# Patient Record
Sex: Male | Born: 1975 | Race: White | Hispanic: No | Marital: Single | Smoking: Current every day smoker
Health system: Southern US, Community
[De-identification: ages and names within clinical notes are randomized; demographics above are authoritative.]

## PROBLEM LIST (undated history)

## (undated) DIAGNOSIS — F32A Depression, unspecified: Secondary | ICD-10-CM

## (undated) DIAGNOSIS — R059 Cough, unspecified: Secondary | ICD-10-CM

## (undated) DIAGNOSIS — E8809 Other disorders of plasma-protein metabolism, not elsewhere classified: Secondary | ICD-10-CM

## (undated) DIAGNOSIS — N200 Calculus of kidney: Secondary | ICD-10-CM

## (undated) DIAGNOSIS — T8859XA Other complications of anesthesia, initial encounter: Secondary | ICD-10-CM

## (undated) DIAGNOSIS — F329 Major depressive disorder, single episode, unspecified: Secondary | ICD-10-CM

## (undated) DIAGNOSIS — S43439A Superior glenoid labrum lesion of unspecified shoulder, initial encounter: Secondary | ICD-10-CM

## (undated) DIAGNOSIS — T4145XA Adverse effect of unspecified anesthetic, initial encounter: Secondary | ICD-10-CM

## (undated) DIAGNOSIS — R202 Paresthesia of skin: Secondary | ICD-10-CM

## (undated) DIAGNOSIS — R05 Cough: Secondary | ICD-10-CM

## (undated) HISTORY — PX: CLEFT LIP REPAIR: SUR1164

## (undated) HISTORY — PX: KNEE ARTHROSCOPY: SUR90

## (undated) HISTORY — PX: JOINT REPLACEMENT: SHX530

## (undated) HISTORY — PX: SHOULDER ARTHROSCOPY: SHX128

---
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
## 1997-12-07 ENCOUNTER — Emergency Department (HOSPITAL_COMMUNITY): Admission: EM | Admit: 1997-12-07 | Discharge: 1997-12-07 | Payer: Self-pay | Admitting: Emergency Medicine

## 1999-07-12 ENCOUNTER — Emergency Department (HOSPITAL_COMMUNITY): Admission: EM | Admit: 1999-07-12 | Discharge: 1999-07-12 | Payer: Self-pay

## 2001-03-27 ENCOUNTER — Emergency Department (HOSPITAL_COMMUNITY): Admission: EM | Admit: 2001-03-27 | Discharge: 2001-03-27 | Payer: Self-pay | Admitting: Emergency Medicine

## 2001-04-27 ENCOUNTER — Emergency Department (HOSPITAL_COMMUNITY): Admission: EM | Admit: 2001-04-27 | Discharge: 2001-04-27 | Payer: Self-pay

## 2002-01-11 ENCOUNTER — Emergency Department (HOSPITAL_COMMUNITY): Admission: EM | Admit: 2002-01-11 | Discharge: 2002-01-11 | Payer: Self-pay

## 2002-01-15 ENCOUNTER — Ambulatory Visit (HOSPITAL_COMMUNITY): Admission: RE | Admit: 2002-01-15 | Discharge: 2002-01-15 | Payer: Self-pay

## 2002-01-29 ENCOUNTER — Ambulatory Visit (HOSPITAL_COMMUNITY): Admission: RE | Admit: 2002-01-29 | Discharge: 2002-01-29 | Payer: Self-pay | Admitting: General Surgery

## 2002-01-29 ENCOUNTER — Encounter (INDEPENDENT_AMBULATORY_CARE_PROVIDER_SITE_OTHER): Payer: Self-pay | Admitting: Specialist

## 2002-01-29 HISTORY — PX: LAPAROSCOPIC CHOLECYSTECTOMY: SUR755

## 2004-06-24 ENCOUNTER — Emergency Department (HOSPITAL_COMMUNITY): Admission: EM | Admit: 2004-06-24 | Discharge: 2004-06-25 | Payer: Self-pay | Admitting: Emergency Medicine

## 2006-09-04 ENCOUNTER — Encounter: Admission: RE | Admit: 2006-09-04 | Discharge: 2006-09-04 | Payer: Self-pay | Admitting: Family Medicine

## 2009-10-17 ENCOUNTER — Ambulatory Visit (HOSPITAL_COMMUNITY): Admission: RE | Admit: 2009-10-17 | Discharge: 2009-10-17 | Payer: Self-pay | Admitting: Orthopedic Surgery

## 2009-11-13 ENCOUNTER — Ambulatory Visit: Payer: Self-pay | Admitting: Diagnostic Radiology

## 2009-11-13 ENCOUNTER — Emergency Department (HOSPITAL_BASED_OUTPATIENT_CLINIC_OR_DEPARTMENT_OTHER): Admission: EM | Admit: 2009-11-13 | Discharge: 2009-11-13 | Payer: Self-pay | Admitting: Emergency Medicine

## 2010-08-24 LAB — URINALYSIS, ROUTINE W REFLEX MICROSCOPIC
Bilirubin Urine: NEGATIVE
Glucose, UA: NEGATIVE mg/dL
Ketones, ur: NEGATIVE mg/dL
Nitrite: NEGATIVE
Protein, ur: 30 mg/dL — AB
Specific Gravity, Urine: 1.024 (ref 1.005–1.030)
Urobilinogen, UA: 1 mg/dL (ref 0.0–1.0)
pH: 6 (ref 5.0–8.0)

## 2010-08-24 LAB — URINE MICROSCOPIC-ADD ON

## 2010-10-23 NOTE — Op Note (Signed)
NAME:  Tyler Guerrero, Tyler Guerrero                        ACCOUNT NO.:  [id number]   MEDICAL RECORD NO.:  [id number]                   PATIENT TYPE:  OIB   LOCATION:  6733                                 FACILITY:  [facility]   PHYSICIAN:  Marta Lamas. Gae Bon, M.D.            DATE OF BIRTH:  02-22-1976   DATE OF PROCEDURE:  DATE OF DISCHARGE:  01/29/2002                                 OPERATIVE REPORT   PREOPERATIVE DIAGNOSES:  Symptomatic cholelithiasis.   POSTOPERATIVE DIAGNOSES:  Symptomatic cholelithiasis with subacute  cholecystitis.   OPERATION PERFORMED:  Laparoscopic cholecystectomy.   SURGEON:  Marta Lamas. Lindie Spruce, M.D.   ASSISTANT:  Gabrielle Dare. Janee Morn, M.D.   ANESTHESIA:  General endotracheal.   ESTIMATED BLOOD LOSS:  Less than 20 cc.   COMPLICATIONS:  None.   CONDITION:  Stable.   INDICATIONS FOR PROCEDURE:  The patient is a 35 year old male with acute  gallbladder disease diagnosed by ultrasound recently in the emergency  department and comes in now for  laparoscopic cholecystectomy.   OPERATIVE FINDINGS:  The patient had some omental adhesions to the  gallbladder which was thickened and mildly acutely inflamed.  The anatomy  was otherwise normal.   DESCRIPTION OF PROCEDURE:  The patient was taken to the operating room and  placed on the table in the supine position.  After an adequate endotracheal  anesthetic was administered, the patient was prepped and draped in the usual  sterile manner exposing the midline in the right upper quadrant.   A superumbilical curvilinear incision was made using a #11 blade and taken  down to the midline fascia.  It was through this fascia that a Veress needle  was passed into the peritoneal cavity and confirmed to be in adequate  position using the saline test.  Once this was done, carbon dioxide  infiltration up to a maximal intra-abdominal pressure of 15 mmHg was  instilled into the peritoneal cavity.  Subsequently a 10-11 mm cannula and  trocar was passed through the superumbilical fascia into the peritoneal  cavity and confirmed to be in position with the laparoscope and attached  camera and light source.  Once this was done, two right costal margin 5 mm  cannulas were used in the subxiphoid.  11-12 mm cannula was passed under  direct vision into the peritoneal cavity.  Once all cannulas were on place,  we put on the appropriate adaptors.  The patient was placed in steeper  reversed Trendelenburg position and the left side was tilted down.   Using a ratcheted grasper the dome of the gallbladder was retracted toward  the anterior abdominal wall and towards the right upper quadrant.  A second  grasper was passed down to the infundibulum of the gallbladder.  We used a  dissector to dissect out the peritoneum over the triangle of Calot and  hepatoduodenal triangle.  An adequate window was made behind both the cystic  duct  and the cystic artery both of which were endoclipped proximally and  distally.  Both were transected and then subsequently the gallbladder  dissected out of its bed with minimal difficulty, no cholangiogram was done.   The anatomy was straight forward with the cystic artery coursing through the  triangle of Calot.  Once we had the gallbladder dissected out of its bed, it  was brought out through the superumbilical fascia using large grasper.  The  large stone that was contained in the gallbladder had to be crushed in the  open gallbladder at the level of the superumbilical skin.  Once we had the  gallbladder removed, we irrigated with approximately 1.2L of saline.  There  was minimal bleeding.  We allowed all cannulas to be removed and gas escaped  through the cannula sites.   The superumbilical fascia was reapproximated using a figure-of-eight stitch  of 0 Vicryl passed on the UR6 needle.  We then closed the skin using running  subcuticular suture of 4-0 Vicryl.  17 cc of Marcaine was used at all sites   and then sterile dressings were applied.                                                 Marta Lamas. Gae Bon, M.D.    JOW/MEDQ  D:  01/29/2002  T:  01/31/2002  Job:  [id number]

## 2010-10-23 NOTE — H&P (Signed)
   NAME:  Tyler Guerrero, Tyler Guerrero                        ACCOUNT NO.:  [id number]   MEDICAL RECORD NO.:  [id number]                   PATIENT TYPE:  OIB   LOCATION:  6733                                 FACILITY:  [facility]   PHYSICIAN:  Marta Lamas. Gae Bon, M.D.            DATE OF BIRTH:  1976/04/13   DATE OF ADMISSION:  01/29/2002  DATE OF DISCHARGE:  01/29/2002                                HISTORY & PHYSICAL   CHIEF COMPLAINT:  The patient is an 35 year old male seen in [facility] emergency room recently with symptomatic cholelithiasis.   HISTORY OF PRESENT ILLNESS:  The patient was seen in my office on January 16, 2002, after being seen about a week prior to that in the [facility]  emergency room with abdominal pain in a band extending across the  epigastrium and right upper quadrant.  He had no nausea, vomiting, or  fevers, but possibly some chills.  He had no jaundice.  This was the first  episode of this type and he cannot exactly pinpoint it to a certain type of  food, however, it has improved with avoidance of greasy, fried, and spicy  foods.   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   FAMILY HISTORY:  Unremarkable.   SOCIAL HISTORY:  He is a one-pack-per-day smoker and has been for 15 years.   REVIEW OF SYSTEMS:  Unremarkable.   PHYSICAL EXAMINATION:  VITAL SIGNS:  Blood pressure 140/98.  HEENT:  He is normocephalic and atraumatic and anicteric.  NECK:  Supple with no bruits.  CHEST:  Clear to auscultation.  HEART:  Regular rate and rhythm with no murmurs, gallops, liths, or heaves.  ABDOMEN:  Soft.  He has no tenderness in the right upper quadrant or the  epigastrium, however, he had recently took his pain medicine.  RECTAL:  Not performed.   LABORATORY DATA:  All pending.   IMPRESSION:  Symptomatic cholelithiasis in an otherwise healthy, 35 year old  male, who is a smoker.   PLAN:   Preoperative antibiotics and laparoscopic cholecystectomy and  possible cholangiogram depending upon of dictations of liver function tests.  However, this will not be done routinely as anatomy is straightforward.  PAS  hose in the OR will be placed.                                               Marta Lamas. Gae Bon, M.D.    JOW/MEDQ  D:  01/29/2002  T:  01/30/2002  Job:  [id number]

## 2011-07-30 ENCOUNTER — Encounter (HOSPITAL_BASED_OUTPATIENT_CLINIC_OR_DEPARTMENT_OTHER): Payer: Self-pay | Admitting: Emergency Medicine

## 2011-07-30 ENCOUNTER — Emergency Department (INDEPENDENT_AMBULATORY_CARE_PROVIDER_SITE_OTHER): Payer: 59

## 2011-07-30 ENCOUNTER — Emergency Department (HOSPITAL_BASED_OUTPATIENT_CLINIC_OR_DEPARTMENT_OTHER)
Admission: EM | Admit: 2011-07-30 | Discharge: 2011-07-30 | Disposition: A | Discharge status: Home Health | Payer: 59 | Attending: Emergency Medicine | Admitting: Emergency Medicine

## 2011-07-30 DIAGNOSIS — F17200 Nicotine dependence, unspecified, uncomplicated: Secondary | ICD-10-CM | POA: Insufficient documentation

## 2011-07-30 DIAGNOSIS — M25519 Pain in unspecified shoulder: Secondary | ICD-10-CM | POA: Insufficient documentation

## 2011-07-30 MED ORDER — OXYCODONE-ACETAMINOPHEN 5-325 MG PO TABS: 2.0000 | ORAL_TABLET | ORAL | Status: AC | PRN

## 2011-07-30 NOTE — ED Provider Notes (Signed)
History     CSN: [id number]  Arrival date & time 07/30/11  0108   First MD Initiated Contact with Patient 07/30/11 0308      Chief Complaint  Patient presents with  . Shoulder Pain    (Consider location/radiation/quality/duration/timing/severity/associated sxs/prior treatment) HPI  36yoM previously heatlhy pw Rt shoulder pain x 2 weeks. Describes as sharp, worse with movement. His pain as a 4/5 at this time. No h/o similar. Denies radiation down his arm or from his neck. He denies numbness, tingling, weakness of his arm. There is no history of trauma. Denies other complaints at this time including no chest pain, shortness of breath.   ED Notes, ED Provider Notes from 07/30/11 0000 to 07/30/11 01:19:18       Bufford Buttner, RN 07/30/2011 01:17      Pt c/o right shoulder pain x 2 weeks. No known injury     History reviewed. No pertinent past medical history.  Past Surgical History  Procedure Date  . Knee arthroscopy   . Shoulder surgery     No family history on file.  History  Substance Use Topics  . Smoking status: Current Everyday Smoker  . Smokeless tobacco: Not on file  . Alcohol Use: Yes      Review of Systems  All other systems reviewed and are negative.   except as noted HPI  Allergies  Review of patient's allergies indicates no known allergies.  Home Medications   Current Outpatient Rx  Name Route Sig Dispense Refill  . IBUPROFEN 400 MG PO TABS Oral Take by mouth as needed.    Marland Kitchen NAPROXEN 375 MG PO TABS Oral Take by mouth as needed.    . OXYCODONE-ACETAMINOPHEN 5-325 MG PO TABS Oral Take 2 tablets by mouth every 4 (four) hours as needed for pain. 15 tablet 0    BP 151/92  Pulse 79  Temp(Src) 98.1 F (36.7 C) (Oral)  Resp 18  SpO2 99%  Physical Exam  Nursing note and vitals reviewed. Constitutional: He is oriented to person, place, and time. He appears well-developed and well-nourished. No distress.  HENT:  Head: Atraumatic.    Mouth/Throat: Oropharynx is clear and moist.  Eyes: Conjunctivae are normal. Pupils are equal, round, and reactive to light.  Neck: Neck supple.  Cardiovascular: Normal rate, regular rhythm, normal heart sounds and intact distal pulses.  Exam reveals no gallop and no friction rub.   No murmur heard. Pulmonary/Chest: Effort normal. No respiratory distress. He has no wheezes. He has no rales.  Abdominal: Soft. Bowel sounds are normal. There is no tenderness. There is no rebound and no guarding.  Musculoskeletal: Normal range of motion. He exhibits no edema and no tenderness.       Shoulder without ecchymosis, deformity. There is minimal tenderness to palpation at the a.c. joint. Strength 5/ 5 at the shoulder, elbow, grip. Gross sensation intact. Distal pulses intact. Capillary refill < 3 sec  Neurological: He is alert and oriented to person, place, and time.  Skin: Skin is warm and dry.  Psychiatric: He has a normal mood and affect.    ED Course  Procedures (including critical care time)  Labs Reviewed - No data to display Dg Shoulder Right  07/30/2011  *RADIOLOGY REPORT*  Clinical Data: Right shoulder pain.  No known injury.  RIGHT SHOULDER - 2+ VIEW  Comparison: 07/19/2011.  Findings: Mild to moderate inferior glenohumeral spur formation is currently visible.  Minimal inferior acromioclavicular spur formation.  IMPRESSION: Degenerative changes, as  described above.  Original Report Authenticated By: Darrol Angel, M.D.    1. Shoulder pain     MDM  Shoulder pain unk cause. Degenerative joint disease as above. Neurovasc intact. No trauma or fracture. Full ROM. Will have him f/u with his orthopedic surgeon or sports medicine as needed if symptoms persist. Comfortable with plans for discharge home.        Forbes Cellar, MD 07/30/11 [phone]

## 2011-07-30 NOTE — ED Notes (Signed)
Pt c/o right shoulder pain x 2 weeks. No known injury

## 2011-10-08 ENCOUNTER — Other Ambulatory Visit: Payer: Self-pay | Admitting: Otolaryngology

## 2011-10-26 ENCOUNTER — Encounter (HOSPITAL_BASED_OUTPATIENT_CLINIC_OR_DEPARTMENT_OTHER): Payer: Self-pay | Admitting: *Deleted

## 2011-10-26 NOTE — Progress Notes (Signed)
NPO AFTER MN. ARRIVES AT 0700. NEEDS HG AND EKG. PT STATES WT. 340LB, LAST WEIGHED IN FEB 2013. PT ENCOURAGED TO COME BEFORE DOS TO GET WEIGHED. PT CAN COME ON Thursday 10-28-2011 AFTERNOON. ALSO, WILL REVIEW CHART W/ MDA.

## 2011-10-29 NOTE — Progress Notes (Signed)
PT CAME TODAY TO BE WEIGHED.  WT. 334LB.

## 2011-11-02 NOTE — Anesthesia Preprocedure Evaluation (Addendum)
Anesthesia Evaluation  Patient identified by MRN, date of birth, ID band Patient awake  General Assessment Comment:Prior cleft lip repair  Reviewed: Allergy & Precautions, H&P , NPO status , Patient's Chart, lab work & pertinent test results  Airway Mallampati: III TM Distance: >3 FB Neck ROM: Full    Dental  (+) Teeth Intact, Caps and Dental Advisory Given,    Pulmonary Current Smoker (22 vpack years),    + wheezing      Cardiovascular negative cardio ROS  Rhythm:Regular Rate:Normal     Neuro/Psych negative neurological ROS  negative psych ROS   GI/Hepatic negative GI ROS, Neg liver ROS,   Endo/Other  Morbid obesity  Renal/GU negative Renal ROS  negative genitourinary   Musculoskeletal negative musculoskeletal ROS (+)   Abdominal (+) + obese,   Peds  Hematology negative hematology ROS (+)   Anesthesia Other Findings Permanent bridge upper incisors  Reproductive/Obstetrics negative OB ROS                         Anesthesia Physical Anesthesia Plan  ASA: III  Anesthesia Plan: General   Post-op Pain Management:    Induction: Intravenous  Airway Management Planned: Oral ETT  Additional Equipment:   Intra-op Plan:   Post-operative Plan: Extubation in OR  Informed Consent: I have reviewed the patients History and Physical, chart, labs and discussed the procedure including the risks, benefits and alternatives for the proposed anesthesia with the patient or authorized representative who has indicated his/her understanding and acceptance.   Dental advisory given  Plan Discussed with: CRNA  Anesthesia Plan Comments:         Anesthesia Quick Evaluation

## 2011-11-03 ENCOUNTER — Encounter (HOSPITAL_BASED_OUTPATIENT_CLINIC_OR_DEPARTMENT_OTHER): Payer: Self-pay | Admitting: *Deleted

## 2011-11-03 ENCOUNTER — Encounter (HOSPITAL_BASED_OUTPATIENT_CLINIC_OR_DEPARTMENT_OTHER): Payer: Self-pay | Admitting: Anesthesiology

## 2011-11-03 ENCOUNTER — Encounter (HOSPITAL_COMMUNITY)
Admission: RE | Disposition: A | Discharge status: Home / Self Care | Payer: Self-pay | Source: Ambulatory Visit | Attending: Specialist

## 2011-11-03 ENCOUNTER — Ambulatory Visit (HOSPITAL_BASED_OUTPATIENT_CLINIC_OR_DEPARTMENT_OTHER)
Admission: RE | Admit: 2011-11-03 | Discharge: 2011-11-03 | Disposition: A | Discharge status: Home / Self Care | Payer: 59 | Source: Ambulatory Visit | Attending: Specialist | Admitting: Specialist

## 2011-11-03 ENCOUNTER — Ambulatory Visit (HOSPITAL_BASED_OUTPATIENT_CLINIC_OR_DEPARTMENT_OTHER): Payer: 59 | Admitting: Anesthesiology

## 2011-11-03 DIAGNOSIS — M25819 Other specified joint disorders, unspecified shoulder: Secondary | ICD-10-CM | POA: Insufficient documentation

## 2011-11-03 DIAGNOSIS — M19019 Primary osteoarthritis, unspecified shoulder: Secondary | ICD-10-CM | POA: Insufficient documentation

## 2011-11-03 DIAGNOSIS — M7540 Impingement syndrome of unspecified shoulder: Secondary | ICD-10-CM

## 2011-11-03 DIAGNOSIS — M24119 Other articular cartilage disorders, unspecified shoulder: Secondary | ICD-10-CM | POA: Insufficient documentation

## 2011-11-03 DIAGNOSIS — Z79899 Other long term (current) drug therapy: Secondary | ICD-10-CM | POA: Insufficient documentation

## 2011-11-03 HISTORY — DX: Paresthesia of skin: R20.2

## 2011-11-03 HISTORY — DX: Superior glenoid labrum lesion of unspecified shoulder, initial encounter: S43.439A

## 2011-11-03 HISTORY — PX: SHOULDER ARTHROSCOPY: SHX128

## 2011-11-03 LAB — POCT HEMOGLOBIN-HEMACUE: Hemoglobin: 15.8 g/dL (ref 13.0–17.0)

## 2011-11-03 LAB — POTASSIUM: Potassium: 4.3 mEq/L (ref 3.5–5.1)

## 2011-11-03 SURGERY — ARTHROSCOPY, SHOULDER
Anesthesia: General | Site: Shoulder | Laterality: Right | Wound class: Clean

## 2011-11-03 MED ORDER — HYDROMORPHONE HCL PF 1 MG/ML IJ SOLN
INTRAMUSCULAR | Status: AC
  Administered 2011-11-03: 0.5 mg via INTRAVENOUS
  Administered 2011-11-03: 0.5 mg
  Filled 2011-11-03: qty 1

## 2011-11-03 MED ORDER — ONDANSETRON HCL 4 MG/2ML IJ SOLN
INTRAMUSCULAR | Status: DC | PRN
  Administered 2011-11-03: 4 mg via INTRAVENOUS

## 2011-11-03 MED ORDER — LACTATED RINGERS IV SOLN
INTRAVENOUS | Status: DC
Start: 2011-11-03 — End: 2011-11-03

## 2011-11-03 MED ORDER — PROPOFOL 10 MG/ML IV EMUL
INTRAVENOUS | Status: DC | PRN
  Administered 2011-11-03 (×2): 25 mg via INTRAVENOUS
  Administered 2011-11-03: 300 mg via INTRAVENOUS

## 2011-11-03 MED ORDER — LACTATED RINGERS IV SOLN: INTRAVENOUS | Status: DC

## 2011-11-03 MED ORDER — DEXAMETHASONE SODIUM PHOSPHATE 4 MG/ML IJ SOLN
INTRAMUSCULAR | Status: DC | PRN
  Administered 2011-11-03: 10 mg via INTRAVENOUS

## 2011-11-03 MED ORDER — OXYCODONE-ACETAMINOPHEN 5-325 MG PO TABS: 1.0000 | ORAL_TABLET | ORAL | Status: AC | PRN

## 2011-11-03 MED ORDER — KETOROLAC TROMETHAMINE 10 MG PO TABS: 10.0000 mg | ORAL_TABLET | Freq: Four times a day (QID) | ORAL | Status: AC | PRN

## 2011-11-03 MED ORDER — OXYCODONE-ACETAMINOPHEN 5-325 MG PO TABS
ORAL_TABLET | ORAL | Status: AC
  Filled 2011-11-03: qty 1

## 2011-11-03 MED ORDER — HYDROMORPHONE HCL PF 1 MG/ML IJ SOLN: 0.5000 mg | INTRAMUSCULAR | Status: DC | PRN

## 2011-11-03 MED ORDER — CEFAZOLIN SODIUM-DEXTROSE 2-3 GM-% IV SOLR
2.0000 g | Freq: Four times a day (QID) | INTRAVENOUS | Status: DC
  Filled 2011-11-03 (×3): qty 50

## 2011-11-03 MED ORDER — LACTATED RINGERS IV SOLN
INTRAVENOUS | Status: DC
  Administered 2011-11-03 (×2): via INTRAVENOUS

## 2011-11-03 MED ORDER — MIDAZOLAM HCL 5 MG/ML IJ SOLN
INTRAMUSCULAR | Status: AC
  Administered 2011-11-03: 1 mg via INTRAVENOUS
  Filled 2011-11-03: qty 1

## 2011-11-03 MED ORDER — MIDAZOLAM HCL 5 MG/5ML IJ SOLN
INTRAMUSCULAR | Status: DC | PRN
  Administered 2011-11-03: 2 mg via INTRAVENOUS

## 2011-11-03 MED ORDER — FENTANYL CITRATE 0.05 MG/ML IJ SOLN: 25.0000 ug | INTRAMUSCULAR | Status: DC | PRN

## 2011-11-03 MED ORDER — SODIUM CHLORIDE 0.9 % IR SOLN
Status: DC | PRN
  Administered 2011-11-03: 09:00:00

## 2011-11-03 MED ORDER — FENTANYL CITRATE 0.05 MG/ML IJ SOLN
INTRAMUSCULAR | Status: DC | PRN
  Administered 2011-11-03: 50 ug via INTRAVENOUS
  Administered 2011-11-03: 100 ug via INTRAVENOUS
  Administered 2011-11-03: 50 ug via INTRAVENOUS

## 2011-11-03 MED ORDER — LIDOCAINE HCL (CARDIAC) 20 MG/ML IV SOLN
INTRAVENOUS | Status: DC | PRN
  Administered 2011-11-03: 80 mg via INTRAVENOUS

## 2011-11-03 MED ORDER — CHLORHEXIDINE GLUCONATE 4 % EX LIQD: 60.0000 mL | Freq: Once | CUTANEOUS | Status: DC

## 2011-11-03 MED ORDER — PROMETHAZINE HCL 25 MG/ML IJ SOLN: 6.2500 mg | INTRAMUSCULAR | Status: DC | PRN

## 2011-11-03 MED ORDER — CEFAZOLIN SODIUM-DEXTROSE 2-3 GM-% IV SOLR
2.0000 g | INTRAVENOUS | Status: AC
  Administered 2011-11-03: 2 g via INTRAVENOUS

## 2011-11-03 MED ORDER — SODIUM CHLORIDE 0.9 % IV SOLN
INTRAVENOUS | Status: DC
  Administered 2011-11-03: 12:00:00 via INTRAVENOUS

## 2011-11-03 MED ORDER — KETOROLAC TROMETHAMINE 10 MG PO TABS
10.0000 mg | ORAL_TABLET | Freq: Once | ORAL | Status: AC
  Administered 2011-11-03: 10 mg via ORAL
  Filled 2011-11-03: qty 1

## 2011-11-03 MED ORDER — ALBUTEROL SULFATE (5 MG/ML) 0.5% IN NEBU
2.5000 mg | INHALATION_SOLUTION | Freq: Once | RESPIRATORY_TRACT | Status: AC
  Administered 2011-11-03: 2.5 mg via RESPIRATORY_TRACT

## 2011-11-03 MED ORDER — BUPIVACAINE-EPINEPHRINE 0.25% -1:200000 IJ SOLN
INTRAMUSCULAR | Status: DC | PRN
  Administered 2011-11-03: 2 mL

## 2011-11-03 MED ORDER — MIDAZOLAM HCL 2 MG/2ML IJ SOLN
0.5000 mg | INTRAMUSCULAR | Status: DC | PRN
  Administered 2011-11-03 (×2): 1 mg via INTRAVENOUS

## 2011-11-03 MED ORDER — OXYCODONE-ACETAMINOPHEN 5-325 MG PO TABS
1.0000 | ORAL_TABLET | ORAL | Status: DC | PRN
  Administered 2011-11-03: 1 via ORAL

## 2011-11-03 MED ORDER — SUCCINYLCHOLINE CHLORIDE 20 MG/ML IJ SOLN
INTRAMUSCULAR | Status: DC | PRN
  Administered 2011-11-03: 160 mg via INTRAVENOUS

## 2011-11-03 SURGICAL SUPPLY — 76 items
APL SKNCLS STERI-STRIP NONHPOA (GAUZE/BANDAGES/DRESSINGS)
BENZOIN TINCTURE PRP APPL 2/3 (GAUZE/BANDAGES/DRESSINGS) IMPLANT
BLADE 4.2CUDA (BLADE) ×3 IMPLANT
BLADE CUDA SHAVER 3.5 (BLADE) ×3 IMPLANT
BLADE CUTTER GATOR 3.5 (BLADE) IMPLANT
BLADE FLAT COURSE (BLADE) IMPLANT
BLADE GREAT WHITE 4.2 (BLADE) IMPLANT
BLADE SURG 11 STRL SS (BLADE) ×3 IMPLANT
BNDG COHESIVE 4X5 TAN NS LF (GAUZE/BANDAGES/DRESSINGS) ×3 IMPLANT
BUR OVAL 4.0 (BURR) IMPLANT
BUR VERTEX HOODED 4.5 (BURR) IMPLANT
CANISTER SUCT LVC 12 LTR MEDI- (MISCELLANEOUS) ×3 IMPLANT
CANISTER SUCTION 2500CC (MISCELLANEOUS) IMPLANT
CANNULA ACUFLEX KIT 5X76 (CANNULA) ×3 IMPLANT
CANNULA SHOULDER 7CM (CANNULA) IMPLANT
CANNULA TWIST IN 8.25X7CM (CANNULA) IMPLANT
CLEANER CAUTERY TIP 5X5 PAD (MISCELLANEOUS) IMPLANT
CLOTH BEACON ORANGE TIMEOUT ST (SAFETY) ×3 IMPLANT
DRAPE LG THREE QUARTER DISP (DRAPES) IMPLANT
DRAPE STERI 35X30 U-POUCH (DRAPES) ×3 IMPLANT
DRAPE U 20/CS (DRAPES) ×6 IMPLANT
DRSG PAD ABDOMINAL 8X10 ST (GAUZE/BANDAGES/DRESSINGS) ×2 IMPLANT
DURAPREP 26ML APPLICATOR (WOUND CARE) ×3 IMPLANT
ELECT MENISCUS 165MM 90D (ELECTRODE) IMPLANT
ELECT NEEDLE TIP 2.8 STRL (NEEDLE) IMPLANT
ELECT REM PT RETURN 9FT ADLT (ELECTROSURGICAL) ×3
ELECTRODE REM PT RTRN 9FT ADLT (ELECTROSURGICAL) ×2 IMPLANT
GLOVE BIO SURGEON STRL SZ 6.5 (GLOVE) ×3 IMPLANT
GLOVE BIOGEL PI IND STRL 6.5 (GLOVE) ×2 IMPLANT
GLOVE BIOGEL PI INDICATOR 6.5 (GLOVE) ×1
GLOVE ECLIPSE 6.0 STRL STRAW (GLOVE) ×2 IMPLANT
GLOVE INDICATOR 7.0 STRL GRN (GLOVE) ×3 IMPLANT
GLOVE SURG SS PI 8.0 STRL IVOR (GLOVE) ×3 IMPLANT
GOWN STRL NON-REIN LRG LVL3 (GOWN DISPOSABLE) IMPLANT
IV NS IRRIG 3000ML ARTHROMATIC (IV SOLUTION) ×4 IMPLANT
NDL SAFETY ECLIPSE 18X1.5 (NEEDLE) ×2 IMPLANT
NDL SPNL 18GX3.5 QUINCKE PK (NEEDLE) ×1 IMPLANT
NDL SUT 6 .5 CRC .975X.05 MAYO (NEEDLE) IMPLANT
NEEDLE 1/2 CIR CATGUT .05X1.09 (NEEDLE) IMPLANT
NEEDLE FILTER BLUNT 18X 1/2SAF (NEEDLE) ×1
NEEDLE FILTER BLUNT 18X1 1/2 (NEEDLE) ×2 IMPLANT
NEEDLE HYPO 18GX1.5 SHARP (NEEDLE) ×3
NEEDLE MAYO TAPER (NEEDLE)
NEEDLE SPNL 18GX3.5 QUINCKE PK (NEEDLE) ×3 IMPLANT
NS IRRIG 500ML POUR BTL (IV SOLUTION) IMPLANT
PACK BASIN DAY SURGERY FS (CUSTOM PROCEDURE TRAY) ×3 IMPLANT
PACK SHOULDER CUSTOM OPM052 (CUSTOM PROCEDURE TRAY) ×3 IMPLANT
PAD CLEANER CAUTERY TIP 5X5 (MISCELLANEOUS)
SET ARTHROSCOPY TUBING (MISCELLANEOUS) ×3
SET ARTHROSCOPY TUBING LN (MISCELLANEOUS) ×2 IMPLANT
SLING ARM FOAM STRAP LRG (SOFTGOODS) IMPLANT
SLING ARM FOAM STRAP XLG (SOFTGOODS) ×2 IMPLANT
SLING ARM IMMOBILIZER LRG (SOFTGOODS) ×3 IMPLANT
SLING ULTRA II LARGE (SOFTGOODS) IMPLANT
SPONGE GAUZE 4X4 12PLY (GAUZE/BANDAGES/DRESSINGS) ×2 IMPLANT
STAPLER VISISTAT 35W (STAPLE) IMPLANT
STRIP CLOSURE SKIN 1/2X4 (GAUZE/BANDAGES/DRESSINGS) IMPLANT
SUT BONE WAX W31G (SUTURE) IMPLANT
SUT ETHIBOND 1 OS 2 18 CR3 (SUTURE) IMPLANT
SUT ETHIBOND 2 OS 4 DA (SUTURE) IMPLANT
SUT ETHILON 4 0 PS 2 18 (SUTURE) ×3 IMPLANT
SUT FIBERWIRE #2 38 REV NDL BL (SUTURE)
SUT MON AB 4-0 PC3 18 (SUTURE) IMPLANT
SUT PDS AB 0 CT 36 (SUTURE) IMPLANT
SUT PROLENE 3 0 PS 2 (SUTURE) IMPLANT
SUT VIC AB 1 CT1 36 (SUTURE) IMPLANT
SUT VIC AB 1-0 CT2 27 (SUTURE) IMPLANT
SUT VIC AB 2-0 CT2 27 (SUTURE) ×1 IMPLANT
SUT VICRYL 0 UR6 27IN ABS (SUTURE) ×2 IMPLANT
SUT VICRYL 0-0 OS 2 NEEDLE (SUTURE) IMPLANT
SUTURE FIBERWR#2 38 REV NDL BL (SUTURE) IMPLANT
SYRINGE 10CC LL (SYRINGE) ×3 IMPLANT
TAPE HYPAFIX 6X30 (GAUZE/BANDAGES/DRESSINGS) ×3 IMPLANT
TOWEL OR 17X24 6PK STRL BLUE (TOWEL DISPOSABLE) ×6 IMPLANT
WAND 90 DEG TURBOVAC W/CORD (SURGICAL WAND) IMPLANT
WATER STERILE IRR 500ML POUR (IV SOLUTION) ×5 IMPLANT

## 2011-11-03 NOTE — Brief Op Note (Signed)
11/03/2011  9:27 AM  PATIENT:  Tyler Guerrero  36 y.o. male  PRE-OPERATIVE DIAGNOSIS:  right shoulder labrial tear  POST-OPERATIVE DIAGNOSIS:  right shoulder labrial tear  PROCEDURE:  Procedure(s) (LRB): ARTHROSCOPY SHOULDER (Right)  SURGEON:  Surgeon(s) and Role:    * Javier Docker, MD - Primary  PHYSICIAN ASSISTANT:   ASSISTANTS: strader   ANESTHESIA:   general  EBL:     BLOOD ADMINISTERED:none  DRAINS: none   LOCAL MEDICATIONS USED:  MARCAINE     SPECIMEN:  No Specimen  DISPOSITION OF SPECIMEN:  N/A  COUNTS:  YES  TOURNIQUET:  * No tourniquets in log *  DICTATION: .Other Dictation: Dictation Number [id number]  PLAN OF CARE: Admit to inpatient   PATIENT DISPOSITION:  ICU - intubated and hemodynamically stable.   Delay start of Pharmacological VTE agent (>24hrs) due to surgical blood loss or risk of bleeding: no

## 2011-11-03 NOTE — Transfer of Care (Signed)
Immediate Anesthesia Transfer of Care Note  Patient: Tyler Guerrero  Procedure(s) Performed: Procedure(s) (LRB): ARTHROSCOPY SHOULDER (Right)  Patient Location: PACU  Anesthesia Type: General  Level of Consciousness: drowsy   Airway & Oxygen Therapy: patient weak, making respiratory effort, but requiring ventilator assistance. Remains intubated.  Post-op Assessment: Report given to PACU RN and Post -op Vital signs reviewed and stable  Post vital signs: Reviewed and stable  Complications: prolonged muscle relaxation after IV anectine.

## 2011-11-03 NOTE — H&P (Signed)
Tyler Guerrero is an 36 y.o. male.   Chief Complaint: right shoulder pain HPI: 36yo refractory pain impingement right shoulder  Past Medical History  Diagnosis Date  . Labral tear of shoulder right shoulder  . Tingling occasional right arm    Past Surgical History  Procedure Date  . Knee arthroscopy     RIGHT KNEE 2012   &  LEFT KNE  2009  . Laparoscopic cholecystectomy 01-29-2002  . Shoulder arthroscopy 1992    RIGHT  . Cleft lip repair CHILD    History reviewed. No pertinent family history. Social History:  reports that he has been smoking Cigarettes.  He has a 22.5 pack-year smoking history. He has never used smokeless tobacco. He reports that he does not drink alcohol or use illicit drugs.  Allergies: No Known Allergies  Medications Prior to Admission  Medication Sig Dispense Refill  . HYDROcodone-acetaminophen (NORCO) 5-325 MG per tablet Take 1 tablet by mouth every 6 (six) hours as needed.        Results for orders placed during the hospital encounter of 11/03/11 (from the past 48 hour(s))  POCT HEMOGLOBIN-HEMACUE     Status: Normal   Collection Time   11/03/11  7:46 AM      Component Value Range Comment   Hemoglobin 15.8  13.0 - 17.0 (g/dL)    No results found.  Review of Systems  Musculoskeletal: Positive for joint pain.  All other systems reviewed and are negative.    Blood pressure 142/86, pulse 82, temperature 98.2 F (36.8 C), temperature source Oral, resp. rate 20, height 6' (1.829 m), weight 151.501 kg (334 lb), SpO2 96.00%. Physical Exam  Vitals reviewed. Constitutional: He is oriented to person, place, and time. He appears well-developed.  HENT:  Head: Normocephalic.  Eyes: Pupils are equal, round, and reactive to light.  Neck: Normal range of motion.  Cardiovascular: Normal rate.   Respiratory: Effort normal.  GI: Soft.  Musculoskeletal:       Positive impingement right. NT AC right  Neurological: He is alert and oriented to person, place,  and time.  Skin: Skin is warm and dry.     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 Plan RSA SAD possible RCR. Risks discussed.  Latayna Ritchie C 11/03/2011, 8:17 AM

## 2011-11-03 NOTE — OR Nursing (Signed)
Difficulty waking patient up post surgery transported to main OR PACU on vent further testing to be done to rule out  Problems in the future with anesthetics.

## 2011-11-03 NOTE — Anesthesia Postprocedure Evaluation (Signed)
  Anesthesia Post-op Note  Patient: Tyler Guerrero  Procedure(s) Performed: Procedure(s) (LRB): ARTHROSCOPY SHOULDER (Right)  Patient Location: PACU  Anesthesia Type: General  Level of Consciousness: awake, alert , oriented and patient cooperative  Airway and Oxygen Therapy: Patient Spontanous Breathing and Patient connected to face mask oxygen  Post-op Pain: mild  Post-op Assessment: Post-op Vital signs reviewed, Respiratory Function Stable, No signs of Nausea or vomiting and Pain level controlled  Post-op Vital Signs: stable  Complications: adverse drug reaction

## 2011-11-03 NOTE — Anesthesia Procedure Notes (Signed)
Procedure Name: Intubation Date/Time: 11/03/2011 8:34 AM Performed by: Norva Pavlov Pre-anesthesia Checklist: Patient identified, Emergency Drugs available, Suction available and Patient being monitored Patient Re-evaluated:Patient Re-evaluated prior to inductionOxygen Delivery Method: Circle System Utilized Preoxygenation: Pre-oxygenation with 100% oxygen Intubation Type: IV induction and Cricoid Pressure applied Ventilation: Two handed mask ventilation required and Oral airway inserted - appropriate to patient size Laryngoscope Size: Mac and 4 Grade View: Grade II Tube type: Oral Tube size: 8.0 mm Number of attempts: 1 Airway Equipment and Method: stylet and oral airway Placement Confirmation: ETT inserted through vocal cords under direct vision,  positive ETCO2 and breath sounds checked- equal and bilateral Secured at: 23 cm Tube secured with: Tape Dental Injury: Teeth and Oropharynx as per pre-operative assessment

## 2011-11-03 NOTE — Progress Notes (Signed)
Dr Shelle Iron clarified orders:  Pt may be discharged home.

## 2011-11-03 NOTE — Progress Notes (Signed)
Pt and his Mother know to return to [facility] on Monday for lab work. Mother has the written instructions

## 2011-11-03 NOTE — Progress Notes (Signed)
Pt extubated to simple mask by Dr Okey Dupre, tolerated well.  Pt   Responsive and following commands.  RR  19, sats 99%.

## 2011-11-03 NOTE — Progress Notes (Signed)
Pt discharged to home with sling and ice bag. He was instructed to move wrist and fingers and to rmove sling tomorrow and start pendalem  exercises.

## 2011-11-04 ENCOUNTER — Encounter (HOSPITAL_BASED_OUTPATIENT_CLINIC_OR_DEPARTMENT_OTHER): Payer: Self-pay | Admitting: Specialist

## 2011-11-04 NOTE — Op Note (Signed)
NAME:  Tyler Guerrero, Tyler Guerrero NO.:  [id number]  MEDICAL RECORD NO.:  [id number]  LOCATION:  [facility]                         FACILITY:  [facility]  PHYSICIAN:  Jene Every, M.D.    DATE OF BIRTH:  [birth year]  DATE OF PROCEDURE: DATE OF DISCHARGE:  11/03/2011                              OPERATIVE REPORT   PREOPERATIVE DIAGNOSES: 1. Impingement syndrome. 2. Labral tear. 3. Arthrosis of the glenohumeral joint.  POSTOPERATIVE DIAGNOSES: 1. Impingement syndrome. 2. Labral tear. 3. Arthrosis of the glenohumeral joint.  PROCEDURE PERFORMED: 1. Exam under anesthesia. 2. Right shoulder arthroscopy with debridement of the glenohumeral     joint, debridement on anterior and superior labrum. 3. Subacromial decompression/bursectomy, __________ ligament.  ANESTHESIA:  General.  ASSISTANT:  Roma Schanz, P.A.  HISTORY:  A 36 year old with history of shoulder arthroscopy in the past, who was having recent impingement syndrome, popping, and catching. He had undergone corticosteroid injection and physical therapy without avail.  He had a temporary relief from the lidocaine injection, but no relief from the corticosteroid injection.  He had some glenohumeral arthrosis.  He had pain with overhead activity.  MRI indicating a moderate supraspinatus and infraspinatus tendinosis, subscapular tendinosis, chondral loss of the glenohumeral joint, chronic fraying of the glenoid labrum, presented here for arthroscopic debridement, bursectomy, evaluation of the rotator cuff.  The risks and benefits were discussed including bleeding, infection, change in symptoms, worsening symptoms, need for repeat debridement, DVT, PE, anesthetic complications, etc.  TECHNIQUE:  With the patient in supine beach chair position, after the induction of adequate general anesthesia with 1 g of Kefzol, the right shoulder and upper extremity were prepped and draped in usual sterile fashion.  Prior to that, we  examined him under anesthesia and he had a full range.  Surgical marker was utilized on the acromion, AC joint, and coracoid, patient's moderate obesity.  I fashioned standard portals with a #11 blade through the skin only, posterior laterally and anterolaterally based upon the acromion.  Through the skin only, we advanced a cannula posteriorly in the glenohumeral joint in line with coracoid penetrated atraumatically.  Irrigant was utilized to insufflate the joint at 65 mmHg, noted was a significant chondral debris within the glenohumeral joint.  There were some chondral changes on the glenoid and on the humeral head.  Some minor grade 4 changes.  Tearing of the labrum was noted superiorly and anteriorly.  The subscapularis was noted to be intact and frayed.  The biceps tendon was frayed, but in its bicipital groove, we did not sublux it.  Under direct visualization, we fashioned an anterior portal with an 18-gauge needle, penetrating the capsule just beneath the biceps tendon, making this fashioning the small anterior incision, advancing the cannula just beneath the biceps tendon, then lavaged the joint, introduced the shaver, debrided the joint, evacuated loose bodies, debrided the labrum to a stable base.  On inspection of the rotator cuff, the tendon was intact without evidence of tearing in internal and external rotation.  We probed the biceps tendon and it was not subluxable.  Next, we then redirected the camera in the subacromial space.  We triangulated anterolaterally with a cannula.  Hypertrophic bursa was  noted in the subacromial space.  We introduced that and shaved, debrided the bursa.  It was extensive and hypertrophic bursa throughout.  After the full bursectomy and inspecting the cuff anteriorly and posteriorly, we inspected the cuff.  There were hyperemic areas of mild fraying.  This was extensively probed.  No significant tearing.  He had a previous acromioplasty.   __________ ligament, which was partially reattached, but no further pathology amenable to arthroscopic intervention.  I, therefore, removed all instrumentation, and closed the portals with 4-0 nylon simple suture.  Marcaine 0.25% with epinephrine was infiltrated in the joint.  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  Dr. Lucille Passy, felt that this may be an extended recovery and suggested possible ventilation postoperatively.  The patient was in stable position when all instrumentation was removed and the portals were closed with 4-0 nylon simple sutures and 0.25% Marcaine with epinephrine was infiltrated in the joint, and the wound was dressed sterilely.  The patient tolerated the procedure well, with no complications.     Jene Every, M.D.     Cordelia Pen  D:  11/03/2011  T:  11/04/2011  Job:  [id number]

## 2011-11-05 NOTE — Discharge Summary (Signed)
Patient ID: MAEJOR ERVEN MRN: [id number] DOB/AGE: Jul 17, 1975 36 y.o.  Admit date: Nov 07, 2011 Discharge date: 11/05/2011  Admission Diagnoses:  Impingement syndrome right shoulder Past Medical History  Diagnosis Date  . Labral tear of shoulder right shoulder  . Tingling occasional right arm   Discharge Diagnoses:  Same   Surgeries: Procedure(s): ARTHROSCOPY SHOULDER on 2011-11-07    Discharged Condition: Improved  Hospital Course: Tyler Guerrero is an 36 y.o. male who was admitted 2011/11/07 for operative treatment of impingement syndrome. Patient has severe unremitting pain that affects sleep, daily activities, and work/hobbies. After pre-op clearance the patient was taken to the operating room on Nov 07, 2011 and underwent  Procedure(s): ARTHROSCOPY SHOULDER.    Patient was given perioperative antibiotics: Anti-infectives     Start     Dose/Rate Route Frequency Ordered Stop   November 07, 2011 1430   ceFAZolin (ANCEF) IVPB 2 g/50 mL premix  Status:  Discontinued        2 g 100 mL/hr over 30 Minutes Intravenous Every 6 hours November 07, 2011 1223 2011-11-07 1809   November 07, 2011 0712   ceFAZolin (ANCEF) IVPB 2 g/50 mL premix        2 g 100 mL/hr over 30 Minutes Intravenous 60 min pre-op November 07, 2011 4098 Nov 07, 2011 0827           There was some difficulty with waking the patient up post operatively question related to pseudocholinesterase deficiency.  He was monitored and continued on the ventilator until stable. After observation he did begin spontaneous breathing and was extubated.  Patient was monitored by anesthesia and remained stable.  Recent laboratory studies:  Basename 11/07/11 1121 Nov 07, 2011 0746  WBC -- --  HGB -- 15.8  HCT -- --  PLT -- --  NA -- --  K 4.3 --  CL -- --  CO2 -- --  BUN -- --  CREATININE -- --  GLUCOSE -- --  INR -- --  CALCIUM -- --     Discharge Medications:   Medication List  As of 11/05/2011 11:01 AM   STOP taking these medications        HYDROcodone-acetaminophen 5-325 MG per tablet         TAKE these medications         ketorolac 10 MG tablet   Commonly known as: TORADOL   Take 1 tablet (10 mg total) by mouth every 6 (six) hours as needed for pain.      oxyCODONE-acetaminophen 5-325 MG per tablet   Commonly known as: PERCOCET   Take 1 tablet by mouth every 4 (four) hours as needed for pain.              Disposition: 01-Home or Self Care  Discharge Orders    Future Orders Please Complete By Expires   Diet - low sodium heart healthy      Call MD / Call 911      Comments:   If you experience chest pain or shortness of breath, CALL 911 and be transported to the hospital emergency room.  If you develope a fever above 101 F, pus (white drainage) or increased drainage or redness at the wound, or calf pain, call your surgeon's office.   Constipation Prevention      Comments:   Drink plenty of fluids.  Prune juice may be helpful.  You may use a stool softener, such as Colace (over the counter) 100 mg twice a day.  Use MiraLax (over the counter) for constipation as needed.   Increase  activity slowly as tolerated      Discharge instructions      Comments:   Shoulder Arthroscopy  Ice pack to shoulder 3-4 times a day May remove bandage in 48 hours apply bandaids to sutures change daily May get out of sling in AM and start gentle pendulum exercises.  You are encouraged to move the elbow,hand, and wrist.   Elevate shoulder and elbow on pillow several times a day. No lifting with operative arm, avoid overhead activity Ok to shower in 72 hours       Per anesthesia the patient will need follow up lab work secondary to reaction during surgery to r/o pseudocholinesterase deficiency.   Follow-up Information    Follow up with BEANE,JEFFREY C, MD in 14 days.   Contact information:   [facility] [address] [place] [address] [phone]            Signed: Liam Graham. 11/05/2011, 11:01 AM

## 2011-11-09 ENCOUNTER — Encounter (HOSPITAL_BASED_OUTPATIENT_CLINIC_OR_DEPARTMENT_OTHER): Payer: Self-pay

## 2012-01-27 NOTE — Addendum Note (Signed)
Addendum  created 01/27/12 1509 by Adaja Wander F Delainy Mcelhiney, MD   Modules edited:Charting, Inpatient Notes    

## 2012-01-27 NOTE — Addendum Note (Signed)
Addendum  created 01/27/12 1509 by Einar Pheasant, MD   Modules edited:Charting, Inpatient Notes

## 2012-02-01 ENCOUNTER — Ambulatory Visit: Payer: Worker's Compensation | Attending: Physician Assistant | Admitting: Physical Therapy

## 2012-02-01 DIAGNOSIS — IMO0001 Reserved for inherently not codable concepts without codable children: Secondary | ICD-10-CM | POA: Insufficient documentation

## 2012-02-01 DIAGNOSIS — M25569 Pain in unspecified knee: Secondary | ICD-10-CM | POA: Insufficient documentation

## 2012-02-01 DIAGNOSIS — M25669 Stiffness of unspecified knee, not elsewhere classified: Secondary | ICD-10-CM | POA: Insufficient documentation

## 2012-02-02 ENCOUNTER — Ambulatory Visit: Payer: Worker's Compensation | Attending: Physician Assistant | Admitting: Physical Therapy

## 2012-02-02 DIAGNOSIS — M25669 Stiffness of unspecified knee, not elsewhere classified: Secondary | ICD-10-CM | POA: Insufficient documentation

## 2012-02-02 DIAGNOSIS — IMO0001 Reserved for inherently not codable concepts without codable children: Secondary | ICD-10-CM | POA: Insufficient documentation

## 2012-02-02 DIAGNOSIS — M25569 Pain in unspecified knee: Secondary | ICD-10-CM | POA: Insufficient documentation

## 2012-02-04 ENCOUNTER — Ambulatory Visit: Payer: Worker's Compensation | Admitting: Physical Therapy

## 2012-02-08 ENCOUNTER — Ambulatory Visit: Payer: Worker's Compensation | Attending: Physician Assistant | Admitting: Physical Therapy

## 2012-02-08 DIAGNOSIS — M25669 Stiffness of unspecified knee, not elsewhere classified: Secondary | ICD-10-CM | POA: Insufficient documentation

## 2012-02-08 DIAGNOSIS — M25569 Pain in unspecified knee: Secondary | ICD-10-CM | POA: Insufficient documentation

## 2012-02-08 DIAGNOSIS — IMO0001 Reserved for inherently not codable concepts without codable children: Secondary | ICD-10-CM | POA: Insufficient documentation

## 2012-02-10 ENCOUNTER — Ambulatory Visit: Payer: Worker's Compensation | Attending: Physician Assistant | Admitting: Physical Therapy

## 2012-02-10 DIAGNOSIS — M25569 Pain in unspecified knee: Secondary | ICD-10-CM | POA: Insufficient documentation

## 2012-02-10 DIAGNOSIS — M25669 Stiffness of unspecified knee, not elsewhere classified: Secondary | ICD-10-CM | POA: Insufficient documentation

## 2012-02-10 DIAGNOSIS — IMO0001 Reserved for inherently not codable concepts without codable children: Secondary | ICD-10-CM | POA: Insufficient documentation

## 2012-02-11 ENCOUNTER — Ambulatory Visit: Payer: Worker's Compensation | Attending: Physician Assistant | Admitting: Physical Therapy

## 2012-02-11 DIAGNOSIS — M25569 Pain in unspecified knee: Secondary | ICD-10-CM | POA: Insufficient documentation

## 2012-02-11 DIAGNOSIS — M25669 Stiffness of unspecified knee, not elsewhere classified: Secondary | ICD-10-CM | POA: Insufficient documentation

## 2012-02-11 DIAGNOSIS — IMO0001 Reserved for inherently not codable concepts without codable children: Secondary | ICD-10-CM | POA: Insufficient documentation

## 2012-06-03 ENCOUNTER — Encounter (HOSPITAL_BASED_OUTPATIENT_CLINIC_OR_DEPARTMENT_OTHER): Payer: Self-pay | Admitting: *Deleted

## 2012-06-03 ENCOUNTER — Emergency Department (HOSPITAL_BASED_OUTPATIENT_CLINIC_OR_DEPARTMENT_OTHER)
Admission: EM | Admit: 2012-06-03 | Discharge: 2012-06-03 | Disposition: A | Discharge status: Home / Self Care | Payer: 59 | Attending: Emergency Medicine | Admitting: Emergency Medicine

## 2012-06-03 DIAGNOSIS — F17200 Nicotine dependence, unspecified, uncomplicated: Secondary | ICD-10-CM | POA: Insufficient documentation

## 2012-06-03 DIAGNOSIS — K0510 Chronic gingivitis, plaque induced: Secondary | ICD-10-CM | POA: Insufficient documentation

## 2012-06-03 DIAGNOSIS — Z87828 Personal history of other (healed) physical injury and trauma: Secondary | ICD-10-CM | POA: Insufficient documentation

## 2012-06-03 HISTORY — DX: Calculus of kidney: N20.0

## 2012-06-03 MED ORDER — PENICILLIN V POTASSIUM 500 MG PO TABS: 500.0000 mg | ORAL_TABLET | Freq: Three times a day (TID) | ORAL | Status: DC

## 2012-06-03 MED ORDER — HYDROCODONE-ACETAMINOPHEN 5-325 MG PO TABS: 2.0000 | ORAL_TABLET | Freq: Four times a day (QID) | ORAL | Status: DC | PRN

## 2012-06-03 MED ORDER — PENICILLIN V POTASSIUM 250 MG PO TABS
500.0000 mg | ORAL_TABLET | Freq: Once | ORAL | Status: AC
  Administered 2012-06-03: 500 mg via ORAL
  Filled 2012-06-03: qty 2

## 2012-06-03 NOTE — ED Notes (Signed)
Toothache in upper front teeth under bridge.  Hx of same.

## 2012-06-03 NOTE — ED Provider Notes (Signed)
History     CSN: [id number]  Arrival date & time 06/03/12  4098   First MD Initiated Contact with Patient 06/03/12 [phone]      Chief Complaint  Patient presents with  . Dental Pain    (Consider location/radiation/quality/duration/timing/severity/associated sxs/prior treatment) HPI Pt with a dental bridge across his upper front teeth reports 2 days of pain and swelling in the gums adjacent to the bridge. No drainage, no fever.   Past Medical History  Diagnosis Date  . Labral tear of shoulder right shoulder  . Tingling occasional right arm    Past Surgical History  Procedure Date  . Knee arthroscopy     RIGHT KNEE 2012   &  LEFT KNE  2009  . Laparoscopic cholecystectomy 01-29-2002  . Shoulder arthroscopy 1992    RIGHT  . 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 Shoulder arthroscopy 11/03/2011    Procedure: ARTHROSCOPY SHOULDER;  Surgeon: Javier Docker, MD;  Location: [facility];  Service: Orthopedics;  Laterality: Right;  with labrial debridement    No family history on file.  History  Substance Use Topics  . Smoking status: Current Every Day Smoker -- 1.5 packs/day for 15 years    Types: Cigarettes  . Smokeless tobacco: Never Used  . Alcohol Use: No      Review of Systems All other systems reviewed and are negative except as noted in HPI.   Allergies  Review of patient's allergies indicates no known allergies.  Home Medications  No current outpatient prescriptions on file.  BP 152/84  Pulse 77  Temp 98.6 F (37 C) (Oral)  Resp 16  Ht 6' (1.829 m)  Wt 350 lb (158.759 kg)  BMI 47.47 kg/m2  SpO2 98%  Physical Exam  Nursing note and vitals reviewed. Constitutional: He is oriented to person, place, and time. He appears well-developed and well-nourished.  HENT:  Head: Normocephalic and atraumatic.       Swelling and tenderness to gums above the dental bridge, no fluctuance/abscess  Eyes: EOM are normal. Pupils are equal, round, and reactive to light.   Neck: Normal range of motion. Neck supple.  Cardiovascular: Normal rate, normal heart sounds and intact distal pulses.   Pulmonary/Chest: Effort normal and breath sounds normal.  Abdominal: Bowel sounds are normal. He exhibits no distension. There is no tenderness.  Musculoskeletal: Normal range of motion. He exhibits no edema and no tenderness.  Neurological: He is alert and oriented to person, place, and time. He has normal strength. No cranial nerve deficit or sensory deficit.  Skin: Skin is warm and dry. No rash noted.  Psychiatric: He has a normal mood and affect.    ED Course  Procedures (including critical care time)  Labs Reviewed - No data to display No results found.   No diagnosis found.    MDM  Pt states he will be able to see his dentist in 2 days, will begin PCN for gingivitis. Pain meds as needed.         Charles B. Bernette Mayers, MD 06/03/12 [phone]

## 2012-11-21 ENCOUNTER — Emergency Department (INDEPENDENT_AMBULATORY_CARE_PROVIDER_SITE_OTHER)
Admission: EM | Admit: 2012-11-21 | Discharge: 2012-11-21 | Disposition: A | Discharge status: Home / Self Care | Payer: Self-pay | Source: Home / Self Care

## 2012-11-21 ENCOUNTER — Encounter (HOSPITAL_COMMUNITY): Payer: Self-pay | Admitting: Emergency Medicine

## 2012-11-21 DIAGNOSIS — K089 Disorder of teeth and supporting structures, unspecified: Secondary | ICD-10-CM

## 2012-11-21 DIAGNOSIS — K0889 Other specified disorders of teeth and supporting structures: Secondary | ICD-10-CM

## 2012-11-21 DIAGNOSIS — K0510 Chronic gingivitis, plaque induced: Secondary | ICD-10-CM

## 2012-11-21 MED ORDER — HYDROCODONE-ACETAMINOPHEN 7.5-325 MG PO TABS: 1.0000 | ORAL_TABLET | ORAL | Status: DC | PRN

## 2012-11-21 MED ORDER — PENICILLIN V POTASSIUM 500 MG PO TABS: 500.0000 mg | ORAL_TABLET | Freq: Four times a day (QID) | ORAL | Status: AC

## 2012-11-21 NOTE — ED Provider Notes (Signed)
Medical screening examination/treatment/procedure(s) were performed by resident physician or non-physician practitioner and as supervising physician I was immediately available for consultation/collaboration.   Aamilah Augenstein DOUGLAS MD.   Alaa Mullally D Shanicka Oldenkamp, MD 11/21/12 1645 

## 2012-11-21 NOTE — ED Notes (Signed)
Reports toothache that started yesterday, swelling today

## 2012-11-21 NOTE — ED Provider Notes (Signed)
History     CSN: [id number]  Arrival date & time 11/21/12  1013   None     Chief Complaint  Patient presents with  . Dental Pain    (Consider location/radiation/quality/duration/timing/severity/associated sxs/prior treatment) HPI Comments: Complains of toothache  Of the upper left incisor since yesterday; assoc with pain and swelling tracking superiorly aside the nose.   Past Medical History  Diagnosis Date  . Labral tear of shoulder right shoulder  . Tingling occasional right arm  . Kidney stone     Past Surgical History  Procedure Laterality Date  . Knee arthroscopy      RIGHT KNEE 2012   &  LEFT KNE  2009  . Laparoscopic cholecystectomy  01-29-2002  . Shoulder arthroscopy  1992    RIGHT  . 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 Shoulder arthroscopy  11/03/2011    Procedure: ARTHROSCOPY SHOULDER;  Surgeon: Javier Docker, MD;  Location: [facility];  Service: Orthopedics;  Laterality: Right;  with labrial debridement    No family history on file.  History  Substance Use Topics  . Smoking status: Current Every Day Smoker -- 1.50 packs/day for 15 years    Types: Cigarettes  . Smokeless tobacco: Never Used  . Alcohol Use: No      Review of Systems  All other systems reviewed and are negative.    Allergies  Review of patient's allergies indicates no known allergies.  Home Medications   Current Outpatient Rx  Name  Route  Sig  Dispense  Refill  . ibuprofen (ADVIL,MOTRIN) 200 MG tablet   Oral   Take 200 mg by mouth every 6 (six) hours as needed for pain.         . traMADol (ULTRAM) 50 MG tablet   Oral   Take 50 mg by mouth every 6 (six) hours as needed for pain.         Marland Kitchen HYDROcodone-acetaminophen (NORCO) 7.5-325 MG per tablet   Oral   Take 1 tablet by mouth every 4 (four) hours as needed for pain.   15 tablet   0   . HYDROcodone-acetaminophen (NORCO/VICODIN) 5-325 MG per tablet   Oral   Take 2 tablets by mouth every 6 (six) hours as  needed for pain.   30 tablet   0   . penicillin v potassium (VEETID) 500 MG tablet   Oral   Take 1 tablet (500 mg total) by mouth 3 (three) times daily.   30 tablet   0   . penicillin v potassium (VEETID) 500 MG tablet   Oral   Take 1 tablet (500 mg total) by mouth 4 (four) times daily.   28 tablet   0     BP 126/75  Pulse 73  Temp(Src) 98.1 F (36.7 C) (Oral)  Resp 16  SpO2 98%  Physical Exam  Constitutional: He is oriented to person, place, and time. He appears well-developed and well-nourished. No distress.  HENT:  Mouth/Throat: Oropharynx is clear and moist. No oropharyngeal exudate.  Redness and swelling of the gingiva, + dental tenderness. No abscess formation seen. Tender over upper avelolar ridge and paranasal bones  Eyes: EOM are normal.  Neck: Normal range of motion. Neck supple.  Pulmonary/Chest: Effort normal.  Neurological: He is alert and oriented to person, place, and time.  Skin: Skin is warm and dry.  Psychiatric: He has a normal mood and affect.    ED Course  Procedures (including critical care time)  Labs Reviewed - No data to display No results found.   1. Toothache   2. Gingivitis       MDM  Call dentist today norco 7.5 q 4h prn Pen VK 500 QID for 7 d.        Hayden Rasmussen, NP 11/21/12 1043  Hayden Rasmussen, NP 11/21/12 1335

## 2013-04-13 NOTE — Progress Notes (Addendum)
Anesthesia Note:  Patient is a 37 year old male scheduled for left knee arthroscopy with medial menisectomy on 05/11/13 by Dr. Ranell Patrick.  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 He denied OSA, DM, CAD, HTN history. PCP is Dr. Nicholos Johns with [facility].  He denied prior cardiac studies or EKG/CXR within the past year.  Anesthesia history includes PSEUDOCHOLINESTERASE DEFICIENCY. On 11/03/11 he underwent right shoulder arthroscopy with debridement of the glenohumeral joint and anterior and superior labrum with subacromial decompression at [facility]. Patient was noted to have extended response following administration of succinylcholine at induction.  He remained intubated longer then initially anticipated, but from note it appears anesthesia stop time was 11:16 AM and he was extubated at 1156 AM and discharged home at 3:05 PM. Patient reports that he ultimately tested positive for pseudocholinesterase deficiency with labs work from [facility]; however no results were found in Epic.  [facility] did fax labs from 12/24/11 that showed a total pseudocholinesterase low at 1215 U/L and low dibucaine number of 30%, Phenotype-Pseudocholinesterase AS. Interpretation is read as, "The dibucaine number (DN) is the percent of pseudocholinesterase (PchE) enzyme activity that is inhibited by dibucaine.  Together, the DN and the PchE enzyme activity results can help to identify individuals at risk for prolonged paralysis following the administration of succinylcholine.  Decreased PchE enzyme activity in conjunction with a DN < 30 suggests high risk for porlonged paralysis.  Normal to decreased PchE enzyme activity in conjunction with a DN 30-79 suggests variable risk..."  I called patient earlier this week about his anesthesia history and also reviewed with anesthesiologist Dr. Krista Blue.  Plan to avoid agents known to trigger a  adverse response in patients with pseudocholinesterase deficiency.  He will have a PAT visit within two weeks of his surgery.  Velna Ochs [facility] Short Stay Center/Anesthesiology Phone [phone] 04/13/2013 3:40 PM  Addendum: 05/01/2013 11:40 Patient's PAT appointment was earlier today.  He underwent a CXR due to history of cough, but upon questioning this is more chronic.  He denies any acute sick symptoms.  He denies chest pain.  Lungs sounds are clear.  Heart RRR, no murmur noted.  No pitting edema noted at the ankles.  Today's CXR and labs noted. Anticipate that he can proceed as planned with need to avoid succinylcholine due to history.  His assigned anesthesiologist will discuss the definitve anesthesia plan on the day of surgery.

## 2013-04-30 ENCOUNTER — Encounter (HOSPITAL_COMMUNITY): Payer: Self-pay

## 2013-04-30 ENCOUNTER — Other Ambulatory Visit (HOSPITAL_COMMUNITY): Payer: Self-pay | Admitting: *Deleted

## 2013-04-30 NOTE — Pre-Procedure Instructions (Signed)
Tyler Guerrero  04/30/2013   Your procedure is scheduled on:  Friday, May 11, 2013 at 1:00 PM.   Report to [facility] Entrance "A" at 11:00 AM.   Call this number if you have problems the morning of surgery: [phone]   Remember:   Do not eat food or drink liquids after midnight Thursday, 05/10/13.   Take these medicines the morning of surgery with A SIP OF WATER: Tramadol - if needed.  Stop Ibuprofen as of Friday, 05/04/13.   Do not wear jewelry.  Do not wear lotions, powders, or cologne. You may wear deodorant.             Men may shave face and neck.  Do not bring valuables to the hospital.  [facility] is not responsible  for any belongings or valuables.               Contacts, dentures or bridgework may not be worn into surgery.  Leave suitcase in the car. After surgery it may be brought to your room.  For patients admitted to the hospital, discharge time is determined by your  treatment team.               Patients discharged the day of surgery will not be allowed to drive home.  Name and phone number of your driver: Family/friend   Special Instructions: Shower using CHG 2 nights before surgery and the night before surgery.  If you shower the day of surgery use CHG.  Use special wash - you have one bottle of CHG for all showers.  You should use approximately 1/3 of the bottle for each shower.   Please read over the following fact sheets that you were given: Pain Booklet, Coughing and Deep Breathing and Surgical Site Infection Prevention

## 2013-05-01 ENCOUNTER — Encounter (HOSPITAL_COMMUNITY)
Admission: RE | Admit: 2013-05-01 | Discharge: 2013-05-01 | Disposition: A | Discharge status: Home / Self Care | Payer: Medicaid Other | Source: Ambulatory Visit | Attending: Orthopedic Surgery | Admitting: Orthopedic Surgery

## 2013-05-01 ENCOUNTER — Encounter (HOSPITAL_COMMUNITY): Payer: Self-pay

## 2013-05-01 ENCOUNTER — Encounter (HOSPITAL_COMMUNITY)
Admission: RE | Admit: 2013-05-01 | Discharge: 2013-05-01 | Disposition: A | Discharge status: Home / Self Care | Payer: Worker's Compensation | Source: Ambulatory Visit | Attending: Orthopedic Surgery | Admitting: Orthopedic Surgery

## 2013-05-01 DIAGNOSIS — Z01812 Encounter for preprocedural laboratory examination: Secondary | ICD-10-CM | POA: Insufficient documentation

## 2013-05-01 DIAGNOSIS — R05 Cough: Secondary | ICD-10-CM | POA: Insufficient documentation

## 2013-05-01 DIAGNOSIS — R059 Cough, unspecified: Secondary | ICD-10-CM | POA: Insufficient documentation

## 2013-05-01 DIAGNOSIS — Z01818 Encounter for other preprocedural examination: Secondary | ICD-10-CM | POA: Insufficient documentation

## 2013-05-01 DIAGNOSIS — Z01811 Encounter for preprocedural respiratory examination: Secondary | ICD-10-CM | POA: Insufficient documentation

## 2013-05-01 HISTORY — DX: Major depressive disorder, single episode, unspecified: F32.9

## 2013-05-01 HISTORY — DX: Other disorders of plasma-protein metabolism, not elsewhere classified: E88.09

## 2013-05-01 HISTORY — DX: Cough, unspecified: R05.9

## 2013-05-01 HISTORY — DX: Cough: R05

## 2013-05-01 HISTORY — DX: Adverse effect of unspecified anesthetic, initial encounter: T41.45XA

## 2013-05-01 HISTORY — DX: Other complications of anesthesia, initial encounter: T88.59XA

## 2013-05-01 HISTORY — DX: Depression, unspecified: F32.A

## 2013-05-01 LAB — BASIC METABOLIC PANEL
BUN: 8 mg/dL (ref 6–23)
CO2: 25 mEq/L (ref 19–32)
Chloride: 102 mEq/L (ref 96–112)
Glucose, Bld: 101 mg/dL — ABNORMAL HIGH (ref 70–99)
Potassium: 4 mEq/L (ref 3.5–5.1)

## 2013-05-01 LAB — CBC
HCT: 48.6 % (ref 39.0–52.0)
Hemoglobin: 16.8 g/dL (ref 13.0–17.0)
MCH: 29.8 pg (ref 26.0–34.0)
MCHC: 34.6 g/dL (ref 30.0–36.0)
MCV: 86.2 fL (ref 78.0–100.0)

## 2013-05-01 MED ORDER — CHLORHEXIDINE GLUCONATE 4 % EX LIQD: 60.0000 mL | Freq: Once | CUTANEOUS | Status: DC

## 2013-05-01 NOTE — H&P (Signed)
  Tyler Guerrero is an 37 y.o. male.    Chief Complaint: left knee pain  HPI: Pt is a 37 y.o. male complaining of left knee pain for multiple months. Pain had continually increased since the beginning. X-rays in the clinic show meniscal tears to the left knee. Pt has tried various conservative treatments which have failed to alleviate their symptoms, including injections and therapy. Various options are discussed with the patient. Risks, benefits and expectations were discussed with the patient. Patient understand the risks, benefits and expectations and wishes to proceed with surgery.   PCP:  Lolita Patella, MD  D/C Plans:  Home  PMH: Past Medical History  Diagnosis Date  . Labral tear of shoulder right shoulder  . Tingling occasional right arm  . Kidney stone   . Pseudocholinesterase deficiency   . Complication of anesthesia     Pseudocholinesterase deficiency diagnosed 2013  . Depression     not recently  . Cough     smoker's cough    PSH: Past Surgical History  Procedure Laterality Date  . Knee arthroscopy      RIGHT KNEE 2012   &  LEFT KNE  2009  . Laparoscopic cholecystectomy  01-29-2002  . Shoulder arthroscopy  1992    RIGHT  . 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 Shoulder arthroscopy  11/03/2011    Procedure: ARTHROSCOPY SHOULDER;  Surgeon: Javier Docker, MD;  Location: [facility];  Service: Orthopedics;  Laterality: Right;  with labrial debridement    Social History:  reports that he has been smoking Cigarettes.  He has a 22.5 pack-year smoking history. He has never used smokeless tobacco. He reports that he does not drink alcohol or use illicit drugs.  Allergies:  Allergies  Allergen Reactions  . Succinylcholine     Pseudocholinesterase Deficiency  . Tylenol [Acetaminophen]     Cant remember reaction     Medications: No current facility-administered medications for this encounter.   Current Outpatient Prescriptions  Medication Sig  Dispense Refill  . ibuprofen (ADVIL,MOTRIN) 200 MG tablet Take 200 mg by mouth every 6 (six) hours as needed for pain.      . traMADol (ULTRAM) 50 MG tablet Take 50 mg by mouth every 6 (six) hours as needed for pain.        No results found for this or any previous visit (from the past 48 hour(s)). No results found.  ROS: Pain with rom of the left lower extremity  Physical Exam: Alert and oriented 37 y.o. male in no acute distress Cranial nerves 2-12 intact Cervical spine: full rom with no tenderness, nv intact distally Chest: active breath sounds bilaterally, no wheeze rhonchi or rales Heart: regular rate and rhythm, no murmur Abd: non tender non distended with active bowel sounds Hip is stable with rom  Mild to moderate medial joint line tenderness Antalgic gait  nv intact distally No rashes or edema  Assessment/Plan Assessment: left knee pain secondary to meniscal tear  Plan: Patient will undergo a left knee arthroscopy by Dr. Ranell Patrick at [facility]. Risks benefits and expectations were discussed with the patient. Patient understand risks, benefits and expectations and wishes to proceed.

## 2013-05-01 NOTE — Progress Notes (Signed)
Called Dr Norris's office for orders for PAT appointment.

## 2013-05-02 ENCOUNTER — Encounter (HOSPITAL_BASED_OUTPATIENT_CLINIC_OR_DEPARTMENT_OTHER): Payer: Self-pay | Admitting: Emergency Medicine

## 2013-05-02 ENCOUNTER — Emergency Department (HOSPITAL_BASED_OUTPATIENT_CLINIC_OR_DEPARTMENT_OTHER)
Admission: EM | Admit: 2013-05-02 | Discharge: 2013-05-02 | Disposition: A | Discharge status: Home / Self Care | Payer: Medicaid Other | Attending: Emergency Medicine | Admitting: Emergency Medicine

## 2013-05-02 DIAGNOSIS — R319 Hematuria, unspecified: Secondary | ICD-10-CM | POA: Insufficient documentation

## 2013-05-02 DIAGNOSIS — Z87442 Personal history of urinary calculi: Secondary | ICD-10-CM | POA: Insufficient documentation

## 2013-05-02 DIAGNOSIS — Z8659 Personal history of other mental and behavioral disorders: Secondary | ICD-10-CM | POA: Insufficient documentation

## 2013-05-02 DIAGNOSIS — F17200 Nicotine dependence, unspecified, uncomplicated: Secondary | ICD-10-CM | POA: Insufficient documentation

## 2013-05-02 LAB — URINE MICROSCOPIC-ADD ON

## 2013-05-02 LAB — URINALYSIS, ROUTINE W REFLEX MICROSCOPIC
Glucose, UA: NEGATIVE mg/dL
Ketones, ur: 15 mg/dL — AB
Protein, ur: 30 mg/dL — AB

## 2013-05-02 NOTE — ED Provider Notes (Signed)
CSN: [id number]     Arrival date & time 05/02/13  0125 History   First MD Initiated Contact with Patient 05/02/13 0136     No chief complaint on file.  (Consider location/radiation/quality/duration/timing/severity/associated sxs/prior Treatment) HPI Patient has a history of nephrolithiasis and presents with acute onset hematuria this evening. States he urinated red urine without clots. He had mild pressure in the suprapubic region prior to the episode which was relieved after urination. Denies any flank pain, dysuria, fever or chills. He had no nausea or vomiting. He is currently asymptomatic. Past Medical History  Diagnosis Date  . Labral tear of shoulder right shoulder  . Tingling occasional right arm  . Kidney stone   . Pseudocholinesterase deficiency   . Complication of anesthesia     Pseudocholinesterase deficiency diagnosed 2013  . Depression     not recently  . Cough     smoker's cough   Past Surgical History  Procedure Laterality Date  . Knee arthroscopy      RIGHT KNEE 2012   &  LEFT KNE  2009  . Laparoscopic cholecystectomy  01-29-2002  . Shoulder arthroscopy  1992    RIGHT  . 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 Shoulder arthroscopy  11/03/2011    Procedure: ARTHROSCOPY SHOULDER;  Surgeon: Javier Docker, MD;  Location: [facility];  Service: Orthopedics;  Laterality: Right;  with labrial debridement  . Joint replacement     No family history on file. History  Substance Use Topics  . Smoking status: Current Every Day Smoker -- 1.50 packs/day for 15 years    Types: Cigarettes  . Smokeless tobacco: Never Used  . Alcohol Use: No    Review of Systems  Constitutional: Negative for fever and chills.  Gastrointestinal: Negative for nausea, vomiting and abdominal pain.  Genitourinary: Positive for hematuria. Negative for dysuria, frequency, flank pain, discharge and penile pain.  Musculoskeletal: Negative for back pain.  Neurological: Negative for dizziness,  weakness, light-headedness, numbness and headaches.  All other systems reviewed and are negative.    Allergies  Succinylcholine and Tylenol  Home Medications   Current Outpatient Rx  Name  Route  Sig  Dispense  Refill  . ibuprofen (ADVIL,MOTRIN) 200 MG tablet   Oral   Take 200 mg by mouth every 6 (six) hours as needed for pain.         . traMADol (ULTRAM) 50 MG tablet   Oral   Take 50 mg by mouth every 6 (six) hours as needed for pain.          BP 148/88  Pulse 112  Temp(Src) 98.7 F (37.1 C) (Oral)  Resp 22  Ht 6' (1.829 m)  Wt 354 lb (160.573 kg)  BMI 48.00 kg/m2  SpO2 97% Physical Exam  Nursing note and vitals reviewed. Constitutional: He is oriented to person, place, and time. He appears well-developed and well-nourished. No distress.  HENT:  Head: Normocephalic and atraumatic.  Mouth/Throat: Oropharynx is clear and moist.  Eyes: EOM are normal. Pupils are equal, round, and reactive to light.  Neck: Normal range of motion. Neck supple.  Cardiovascular: Normal rate and regular rhythm.   Pulmonary/Chest: Effort normal and breath sounds normal. No respiratory distress. He has no wheezes. He has no rales.  Abdominal: Soft. Bowel sounds are normal. He exhibits no distension and no mass. There is no tenderness. There is no rebound and no guarding.  Musculoskeletal: Normal range of motion. He exhibits no edema and no  tenderness.  No CVA tenderness bilaterally.  Neurological: He is alert and oriented to person, place, and time.  Skin: Skin is warm and dry. No rash noted. No erythema.  Psychiatric: He has a normal mood and affect. His behavior is normal.    ED Course  Procedures (including critical care time) Labs Review Labs Reviewed  URINALYSIS, ROUTINE W REFLEX MICROSCOPIC - Abnormal; Notable for the following:    Color, Urine RED (*)    APPearance CLOUDY (*)    Hgb urine dipstick LARGE (*)    Bilirubin Urine SMALL (*)    Ketones, ur 15 (*)    Protein, ur  30 (*)    Leukocytes, UA SMALL (*)    All other components within normal limits  URINE MICROSCOPIC-ADD ON - Abnormal; Notable for the following:    Bacteria, UA FEW (*)    All other components within normal limits   Imaging Review Dg Chest 2 View  05/01/2013   CLINICAL DATA:  Cough, preoperative evaluation  EXAM: CHEST  2 VIEW  COMPARISON:  None.  FINDINGS: The heart size and mediastinal contours are within normal limits. Both lungs are clear. The visualized skeletal structures are unremarkable.  IMPRESSION: No active cardiopulmonary disease.   Electronically Signed   By: Alcide Clever M.D.   On: 05/01/2013 10:43    EKG Interpretation   None       MDM  Patient is very well-appearing. His urine is without evidence of infection. Likely patient has small renal calculi which is in the process of passing or has already passed. Patient advised to followup with his urologist should he have persistent hematuria. Return precautions have been given.   Loren Racer, MD 05/02/13 [phone]

## 2013-05-02 NOTE — ED Notes (Signed)
MD at bedside. 

## 2013-05-02 NOTE — ED Notes (Signed)
Felt like needing to urinated a lot all day, slight penile pain  Blood in urine

## 2013-05-08 ENCOUNTER — Emergency Department (HOSPITAL_BASED_OUTPATIENT_CLINIC_OR_DEPARTMENT_OTHER)
Admission: EM | Admit: 2013-05-08 | Discharge: 2013-05-08 | Disposition: A | Discharge status: Home / Self Care | Payer: Medicaid Other | Attending: Emergency Medicine | Admitting: Emergency Medicine

## 2013-05-08 ENCOUNTER — Encounter (HOSPITAL_BASED_OUTPATIENT_CLINIC_OR_DEPARTMENT_OTHER): Payer: Self-pay | Admitting: Emergency Medicine

## 2013-05-08 ENCOUNTER — Emergency Department (HOSPITAL_BASED_OUTPATIENT_CLINIC_OR_DEPARTMENT_OTHER): Payer: Medicaid Other

## 2013-05-08 DIAGNOSIS — Z8659 Personal history of other mental and behavioral disorders: Secondary | ICD-10-CM | POA: Insufficient documentation

## 2013-05-08 DIAGNOSIS — N200 Calculus of kidney: Secondary | ICD-10-CM | POA: Insufficient documentation

## 2013-05-08 DIAGNOSIS — Z862 Personal history of diseases of the blood and blood-forming organs and certain disorders involving the immune mechanism: Secondary | ICD-10-CM | POA: Insufficient documentation

## 2013-05-08 DIAGNOSIS — F17200 Nicotine dependence, unspecified, uncomplicated: Secondary | ICD-10-CM | POA: Insufficient documentation

## 2013-05-08 DIAGNOSIS — Z87828 Personal history of other (healed) physical injury and trauma: Secondary | ICD-10-CM | POA: Insufficient documentation

## 2013-05-08 LAB — URINALYSIS, ROUTINE W REFLEX MICROSCOPIC
Glucose, UA: NEGATIVE mg/dL
Ketones, ur: NEGATIVE mg/dL
Leukocytes, UA: NEGATIVE
Nitrite: NEGATIVE
Specific Gravity, Urine: 1.016 (ref 1.005–1.030)
Urobilinogen, UA: 0.2 mg/dL (ref 0.0–1.0)
pH: 6 (ref 5.0–8.0)

## 2013-05-08 MED ORDER — OXYCODONE HCL 5 MG PO TABS: 5.0000 mg | ORAL_TABLET | ORAL | Status: DC | PRN

## 2013-05-08 NOTE — ED Provider Notes (Signed)
CSN: [id number]     Arrival date & time 05/08/13  2015 History   First MD Initiated Contact with Patient 05/08/13 2038     Chief Complaint  Patient presents with  . Flank Pain  . Urinary Frequency   (Consider location/radiation/quality/duration/timing/severity/associated sxs/prior Treatment) Patient is a 37 y.o. male presenting with flank pain and frequency. The history is provided by the patient. No language interpreter was used.  Flank Pain This is a new problem. The current episode started 1 to 4 weeks ago. The problem occurs constantly. The problem has been gradually worsening. Associated symptoms include abdominal pain. Nothing aggravates the symptoms. He has tried nothing for the symptoms.  Urinary Frequency Associated symptoms include abdominal pain.  Pt had hematuria on 11/26.   Pt reports blood has resolved but now he has pain in right flank.   Pt is scheduled for knee surgery next week.   Past Medical History  Diagnosis Date  . Labral tear of shoulder right shoulder  . Tingling occasional right arm  . Kidney stone   . Pseudocholinesterase deficiency   . Complication of anesthesia     Pseudocholinesterase deficiency diagnosed 2013  . Depression     not recently  . Cough     smoker's cough   Past Surgical History  Procedure Laterality Date  . Knee arthroscopy      RIGHT KNEE 2012   &  LEFT KNE  2009  . Laparoscopic cholecystectomy  01-29-2002  . Shoulder arthroscopy  1992    RIGHT  . 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 Shoulder arthroscopy  11/03/2011    Procedure: ARTHROSCOPY SHOULDER;  Surgeon: Javier Docker, MD;  Location: [facility];  Service: Orthopedics;  Laterality: Right;  with labrial debridement  . Joint replacement     No family history on file. History  Substance Use Topics  . Smoking status: Current Every Day Smoker -- 1.50 packs/day for 15 years    Types: Cigarettes  . Smokeless tobacco: Never Used  . Alcohol Use: No    Review of Systems   Gastrointestinal: Positive for abdominal pain.  Genitourinary: Positive for frequency and flank pain.  All other systems reviewed and are negative.    Allergies  Succinylcholine and Tylenol  Home Medications   Current Outpatient Rx  Name  Route  Sig  Dispense  Refill  . ibuprofen (ADVIL,MOTRIN) 200 MG tablet   Oral   Take 200 mg by mouth every 6 (six) hours as needed for pain.         . traMADol (ULTRAM) 50 MG tablet   Oral   Take 50 mg by mouth every 6 (six) hours as needed for pain.          BP 172/92  Pulse 96  Temp(Src) 98.4 F (36.9 C) (Oral)  Resp 20  Ht 6' (1.829 m)  Wt 354 lb (160.573 kg)  BMI 48.00 kg/m2  SpO2 98% Physical Exam  Nursing note and vitals reviewed. Constitutional: He is oriented to person, place, and time. He appears well-developed and well-nourished.  HENT:  Head: Normocephalic and atraumatic.  Cardiovascular: Normal rate.   Pulmonary/Chest: Effort normal.  Abdominal: Soft. Bowel sounds are normal.  No cva tenderness  Musculoskeletal: Normal range of motion.  Neurological: He is alert and oriented to person, place, and time. He has normal reflexes.  Skin: Skin is warm.  Psychiatric: He has a normal mood and affect.    ED Course  Procedures (including critical care  time) Labs Review Labs Reviewed  URINALYSIS, ROUTINE W REFLEX MICROSCOPIC   Imaging Review Ct Abdomen Pelvis Wo Contrast  05/08/2013   CLINICAL DATA:  Right flank pain  EXAM: CT ABDOMEN AND PELVIS WITHOUT CONTRAST  TECHNIQUE: Multidetector CT imaging of the abdomen and pelvis was performed following the standard protocol without intravenous contrast.  COMPARISON:  11/13/2009  FINDINGS: Lung bases are predominantly clear. Minimal linear opacity within the middle lobe, favored to reflect atelectasis or scarring. Normal heart size. Trace pericardial fluid versus thickening.  Intra-abdominal organ evaluation is limited in the absence of intravenous contrast. Within this  limitation; no abnormality appreciated within the liver, spleen, pancreas, adrenal glands. Cholecystectomy. No biliary ductal dilatation.  Symmetric renal size. No hydroureteronephrosis. Minimal right periureteral fat stranding. 2 mm right UVJ stone on image 83/100.  No overt colitis. Normal appendix. Small bowel loops are of normal course and caliber. No free intraperitoneal air or fluid. No lymphadenopathy. Normal caliber aorta and branch vessels.  Thin walled bladder.  Normal size prostate.  No acute osseous finding.  IMPRESSION: 2 mm right UVJ stone. Minimal periureteral fat stranding without hydroureteronephrosis.   Electronically Signed   By: Jearld Lesch M.D.   On: 05/08/2013 22:19    EKG Interpretation   None       MDM   1. Kidney stone on right side    Pt has 2mm stone at uvj.   Pt given rx for oxycodone.   Pt advised to strain urine.  Follow up with urologist for evaluation    Elson Areas, PA-C 05/08/13 2231

## 2013-05-08 NOTE — ED Notes (Signed)
Right flank pain x2 days.  Urinary frequency.

## 2013-05-08 NOTE — ED Notes (Signed)
PA at bedside.

## 2013-05-09 NOTE — ED Provider Notes (Signed)
Medical screening examination/treatment/procedure(s) were performed by non-physician practitioner and as supervising physician I was immediately available for consultation/collaboration.  EKG Interpretation   None        Shon Baton, MD 05/09/13 [phone]

## 2013-05-10 MED ORDER — CEFAZOLIN SODIUM-DEXTROSE 2-3 GM-% IV SOLR: 2.0000 g | INTRAVENOUS | Status: DC

## 2013-05-11 ENCOUNTER — Encounter (HOSPITAL_COMMUNITY)
Admission: RE | Disposition: A | Discharge status: Home / Self Care | Payer: Self-pay | Source: Ambulatory Visit | Attending: Orthopedic Surgery

## 2013-05-11 ENCOUNTER — Ambulatory Visit (HOSPITAL_COMMUNITY)
Admission: RE | Admit: 2013-05-11 | Discharge: 2013-05-11 | Disposition: A | Discharge status: Home / Self Care | Payer: Worker's Compensation | Source: Ambulatory Visit | Attending: Orthopedic Surgery | Admitting: Orthopedic Surgery

## 2013-05-11 ENCOUNTER — Encounter (HOSPITAL_COMMUNITY): Payer: Self-pay | Admitting: *Deleted

## 2013-05-11 ENCOUNTER — Encounter (HOSPITAL_COMMUNITY): Payer: Worker's Compensation | Admitting: Vascular Surgery

## 2013-05-11 ENCOUNTER — Ambulatory Visit (HOSPITAL_COMMUNITY): Payer: Worker's Compensation | Admitting: Anesthesiology

## 2013-05-11 DIAGNOSIS — IMO0002 Reserved for concepts with insufficient information to code with codable children: Secondary | ICD-10-CM | POA: Insufficient documentation

## 2013-05-11 DIAGNOSIS — M2240 Chondromalacia patellae, unspecified knee: Secondary | ICD-10-CM | POA: Insufficient documentation

## 2013-05-11 DIAGNOSIS — X500XXA Overexertion from strenuous movement or load, initial encounter: Secondary | ICD-10-CM | POA: Insufficient documentation

## 2013-05-11 DIAGNOSIS — F17200 Nicotine dependence, unspecified, uncomplicated: Secondary | ICD-10-CM | POA: Insufficient documentation

## 2013-05-11 DIAGNOSIS — J410 Simple chronic bronchitis: Secondary | ICD-10-CM | POA: Insufficient documentation

## 2013-05-11 HISTORY — PX: KNEE ARTHROSCOPY: SHX127

## 2013-05-11 SURGERY — ARTHROSCOPY, KNEE
Anesthesia: General | Site: Knee | Laterality: Left

## 2013-05-11 MED ORDER — FENTANYL CITRATE 0.05 MG/ML IJ SOLN
INTRAMUSCULAR | Status: DC | PRN
  Administered 2013-05-11 (×4): 25 ug via INTRAVENOUS
  Administered 2013-05-11: 50 ug via INTRAVENOUS

## 2013-05-11 MED ORDER — OXYCODONE HCL 5 MG PO TABS
ORAL_TABLET | ORAL | Status: AC
  Administered 2013-05-11: 5 mg via ORAL
  Filled 2013-05-11: qty 3

## 2013-05-11 MED ORDER — ONDANSETRON HCL 4 MG/2ML IJ SOLN
INTRAMUSCULAR | Status: DC | PRN
  Administered 2013-05-11: 4 mg via INTRAVENOUS

## 2013-05-11 MED ORDER — DEXTROSE 5 % IV SOLN
3.0000 g | INTRAVENOUS | Status: AC
  Administered 2013-05-11: 3 g via INTRAVENOUS
  Filled 2013-05-11 (×2): qty 3000

## 2013-05-11 MED ORDER — HYDROMORPHONE HCL PF 1 MG/ML IJ SOLN
0.2500 mg | INTRAMUSCULAR | Status: DC | PRN
  Administered 2013-05-11 (×5): 0.5 mg via INTRAVENOUS

## 2013-05-11 MED ORDER — MIDAZOLAM HCL 2 MG/2ML IJ SOLN: 0.5000 mg | Freq: Once | INTRAMUSCULAR | Status: DC | PRN

## 2013-05-11 MED ORDER — KETOROLAC TROMETHAMINE 30 MG/ML IJ SOLN
INTRAMUSCULAR | Status: DC | PRN
  Administered 2013-05-11: 30 mg via INTRAVENOUS

## 2013-05-11 MED ORDER — PROMETHAZINE HCL 25 MG/ML IJ SOLN: 6.2500 mg | INTRAMUSCULAR | Status: DC | PRN

## 2013-05-11 MED ORDER — PROPOFOL 10 MG/ML IV BOLUS
INTRAVENOUS | Status: DC | PRN
  Administered 2013-05-11: 300 mg via INTRAVENOUS

## 2013-05-11 MED ORDER — BUPIVACAINE-EPINEPHRINE (PF) 0.25% -1:200000 IJ SOLN
INTRAMUSCULAR | Status: AC
  Filled 2013-05-11: qty 30

## 2013-05-11 MED ORDER — SODIUM CHLORIDE 0.9 % IR SOLN
Status: DC | PRN
  Administered 2013-05-11: 3000 mL

## 2013-05-11 MED ORDER — MIDAZOLAM HCL 5 MG/5ML IJ SOLN
INTRAMUSCULAR | Status: DC | PRN
  Administered 2013-05-11: 2 mg via INTRAVENOUS

## 2013-05-11 MED ORDER — METHOCARBAMOL 500 MG PO TABS: 500.0000 mg | ORAL_TABLET | Freq: Three times a day (TID) | ORAL | Status: DC | PRN

## 2013-05-11 MED ORDER — OXYCODONE HCL 5 MG PO TABS
10.0000 mg | ORAL_TABLET | Freq: Once | ORAL | Status: AC
  Administered 2013-05-11: 10 mg via ORAL

## 2013-05-11 MED ORDER — OXYCODONE HCL 10 MG PO TABS: 10.0000 mg | ORAL_TABLET | ORAL | Status: DC | PRN

## 2013-05-11 MED ORDER — MEPERIDINE HCL 25 MG/ML IJ SOLN: 6.2500 mg | INTRAMUSCULAR | Status: DC | PRN

## 2013-05-11 MED ORDER — HYDROMORPHONE HCL PF 1 MG/ML IJ SOLN
INTRAMUSCULAR | Status: AC
  Filled 2013-05-11: qty 1

## 2013-05-11 MED ORDER — HYDROMORPHONE HCL PF 1 MG/ML IJ SOLN
INTRAMUSCULAR | Status: AC
  Administered 2013-05-11: 0.5 mg via INTRAVENOUS
  Filled 2013-05-11: qty 1

## 2013-05-11 MED ORDER — LACTATED RINGERS IV SOLN
INTRAVENOUS | Status: DC
  Administered 2013-05-11 (×2): via INTRAVENOUS

## 2013-05-11 MED ORDER — LIDOCAINE HCL (CARDIAC) 20 MG/ML IV SOLN
INTRAVENOUS | Status: DC | PRN
  Administered 2013-05-11: 20 mg via INTRAVENOUS

## 2013-05-11 MED ORDER — OXYCODONE HCL 5 MG PO TABS
5.0000 mg | ORAL_TABLET | Freq: Once | ORAL | Status: AC | PRN
  Administered 2013-05-11: 5 mg via ORAL

## 2013-05-11 MED ORDER — OXYCODONE HCL 5 MG/5ML PO SOLN: 5.0000 mg | Freq: Once | ORAL | Status: AC | PRN

## 2013-05-11 MED ORDER — BUPIVACAINE-EPINEPHRINE PF 0.25-1:200000 % IJ SOLN
INTRAMUSCULAR | Status: DC | PRN
  Administered 2013-05-11: 25 mL

## 2013-05-11 SURGICAL SUPPLY — 40 items
BANDAGE ELASTIC 4 VELCRO ST LF (GAUZE/BANDAGES/DRESSINGS) IMPLANT
BANDAGE ELASTIC 6 VELCRO ST LF (GAUZE/BANDAGES/DRESSINGS) IMPLANT
BANDAGE GAUZE ELAST BULKY 4 IN (GAUZE/BANDAGES/DRESSINGS) ×2 IMPLANT
BLADE CUTTER GATOR 3.5 (BLADE) ×2 IMPLANT
BNDG CMPR MED 15X6 ELC VLCR LF (GAUZE/BANDAGES/DRESSINGS) ×1
BNDG ELASTIC 6X15 VLCR STRL LF (GAUZE/BANDAGES/DRESSINGS) ×1 IMPLANT
CLOTH BEACON ORANGE TIMEOUT ST (SAFETY) ×2 IMPLANT
CLSR STERI-STRIP ANTIMIC 1/2X4 (GAUZE/BANDAGES/DRESSINGS) ×2 IMPLANT
DRAPE ARTHROSCOPY W/POUCH 114 (DRAPES) ×2 IMPLANT
DRSG EMULSION OIL 3X3 NADH (GAUZE/BANDAGES/DRESSINGS) ×1 IMPLANT
DRSG PAD ABDOMINAL 8X10 ST (GAUZE/BANDAGES/DRESSINGS) ×4 IMPLANT
DURAPREP 26ML APPLICATOR (WOUND CARE) ×2 IMPLANT
ELECT MENISCUS 165MM 90D (ELECTRODE) IMPLANT
ELECT REM PT RETURN 9FT ADLT (ELECTROSURGICAL) ×2
ELECTRODE REM PT RTRN 9FT ADLT (ELECTROSURGICAL) ×1 IMPLANT
GAUZE SPONGE 4X4 16PLY XRAY LF (GAUZE/BANDAGES/DRESSINGS) ×2 IMPLANT
GLOVE BIOGEL PI ORTHO PRO 7.5 (GLOVE) ×1
GLOVE BIOGEL PI ORTHO PRO SZ8 (GLOVE) ×1
GLOVE ORTHO TXT STRL SZ7.5 (GLOVE) ×2 IMPLANT
GLOVE PI ORTHO PRO STRL 7.5 (GLOVE) ×1 IMPLANT
GLOVE PI ORTHO PRO STRL SZ8 (GLOVE) ×1 IMPLANT
GLOVE SURG ORTHO 8.5 STRL (GLOVE) ×2 IMPLANT
GOWN STRL NON-REIN LRG LVL3 (GOWN DISPOSABLE) ×4 IMPLANT
GOWN STRL REIN XL XLG (GOWN DISPOSABLE) ×4 IMPLANT
KIT BASIN OR (CUSTOM PROCEDURE TRAY) ×2 IMPLANT
KIT ROOM TURNOVER OR (KITS) ×2 IMPLANT
MANIFOLD NEPTUNE II (INSTRUMENTS) ×2 IMPLANT
PACK ARTHROSCOPY DSU (CUSTOM PROCEDURE TRAY) ×2 IMPLANT
PAD ARMBOARD 7.5X6 YLW CONV (MISCELLANEOUS) ×4 IMPLANT
PENCIL BUTTON HOLSTER BLD 10FT (ELECTRODE) IMPLANT
SET ARTHROSCOPY TUBING (MISCELLANEOUS) ×2
SET ARTHROSCOPY TUBING LN (MISCELLANEOUS) ×1 IMPLANT
SPONGE GAUZE 4X4 12PLY (GAUZE/BANDAGES/DRESSINGS) IMPLANT
STRIP CLOSURE SKIN 1/2X4 (GAUZE/BANDAGES/DRESSINGS) ×2 IMPLANT
SUT MNCRL AB 4-0 PS2 18 (SUTURE) ×2 IMPLANT
TOWEL OR 17X24 6PK STRL BLUE (TOWEL DISPOSABLE) ×4 IMPLANT
TUBE CONNECTING 12X1/4 (SUCTIONS) ×2 IMPLANT
WAND 90 DEG TURBOVAC W/CORD (SURGICAL WAND) IMPLANT
WATER STERILE IRR 1000ML POUR (IV SOLUTION) ×2 IMPLANT
WRAP KNEE MAXI GEL POST OP (GAUZE/BANDAGES/DRESSINGS) ×2 IMPLANT

## 2013-05-11 NOTE — Anesthesia Procedure Notes (Signed)
Procedure Name: LMA Insertion Date/Time: 05/11/2013 2:35 PM Performed by: Sharlene Dory E Pre-anesthesia Checklist: Patient identified, Emergency Drugs available, Suction available, Patient being monitored and Timeout performed Patient Re-evaluated:Patient Re-evaluated prior to inductionOxygen Delivery Method: Circle system utilized Preoxygenation: Pre-oxygenation with 100% oxygen Intubation Type: IV induction Ventilation: Mask ventilation without difficulty LMA: LMA inserted LMA Size: 5.0 Number of attempts: 1 Placement Confirmation: positive ETCO2 and breath sounds checked- equal and bilateral Tube secured with: Tape Dental Injury: Teeth and Oropharynx as per pre-operative assessment

## 2013-05-11 NOTE — Interval H&P Note (Signed)
History and Physical Interval Note:  05/11/2013 2:02 PM  Tyler Guerrero  has presented today for surgery, with the diagnosis of left knee medial mensical tear  The various methods of treatment have been discussed with the patient and family. After consideration of risks, benefits and other options for treatment, the patient has consented to  Procedure(s): LEFT KNEE ARTHROSCOPY WITH MEDIAL MENISECTOMY (Left) as a surgical intervention .  The patient's history has been reviewed, patient examined, no change in status, stable for surgery.  I have reviewed the patient's chart and labs.  Questions were answered to the patient's satisfaction.     Kristene Liberati,STEVEN R

## 2013-05-11 NOTE — Progress Notes (Signed)
Pharmacy called to change dose and send antibiotic.

## 2013-05-11 NOTE — Brief Op Note (Signed)
05/11/2013  3:40 PM  PATIENT:  Tyler Guerrero  37 y.o. male  PRE-OPERATIVE DIAGNOSIS:  left knee medial mensical tear  POST-OPERATIVE DIAGNOSIS:  left knee medial mensical tear  PROCEDURE:  Procedure(s): LEFT KNEE ARTHROSCOPY WITH MEDIAL MENISECTOMY (Left)  SURGEON:  Surgeon(s) and Role:    * Verlee Rossetti, MD - Primary  PHYSICIAN ASSISTANT:   ASSISTANTS: none   ANESTHESIA:   general  EBL:  Total I/O In: 1000 [I.V.:1000] Out: -   BLOOD ADMINISTERED:none  DRAINS: none   LOCAL MEDICATIONS USED:  MARCAINE     SPECIMEN:  No Specimen  DISPOSITION OF SPECIMEN:  N/A  COUNTS:  YES  TOURNIQUET:  * No tourniquets in log *  DICTATION: .Other Dictation: Dictation Number [id number]  PLAN OF CARE: Discharge to home after PACU  PATIENT DISPOSITION:  PACU - hemodynamically stable.   Delay start of Pharmacological VTE agent (>24hrs) due to surgical blood loss or risk of bleeding: not applicable

## 2013-05-11 NOTE — Preoperative (Signed)
Beta Blockers   Reason not to administer Beta Blockers:Not Applicable 

## 2013-05-11 NOTE — Anesthesia Preprocedure Evaluation (Addendum)
Anesthesia Evaluation  Patient identified by MRN, date of birth, ID band Patient awake    Reviewed: Allergy & Precautions, H&P , NPO status , Patient's Chart, lab work & pertinent test results  History of Anesthesia Complications (+) PSEUDOCHOLINESTERASE DEFICIENCY and history of anesthetic complications  Airway Mallampati: II TM Distance: >3 FB Neck ROM: Full    Dental  (+) Caps and Dental Advisory Given   Pulmonary Current Smoker,  breath sounds clear to auscultation  Pulmonary exam normal       Cardiovascular negative cardio ROS  Rhythm:Regular Rate:Normal     Neuro/Psych negative neurological ROS     GI/Hepatic negative GI ROS, Neg liver ROS,   Endo/Other  Morbid obesity  Renal/GU negative Renal ROS     Musculoskeletal   Abdominal (+) + obese,   Peds  Hematology   Anesthesia Other Findings H/o Bilat cleft lip repair  Reproductive/Obstetrics                          Anesthesia Physical Anesthesia Plan  ASA: II  Anesthesia Plan: General   Post-op Pain Management:    Induction: Intravenous  Airway Management Planned: LMA  Additional Equipment:   Intra-op Plan:   Post-operative Plan:   Informed Consent: I have reviewed the patients History and Physical, chart, labs and discussed the procedure including the risks, benefits and alternatives for the proposed anesthesia with the patient or authorized representative who has indicated his/her understanding and acceptance.   Dental advisory given  Plan Discussed with: CRNA and Surgeon  Anesthesia Plan Comments: (Plan routine monitors, GA- LMA OK)        Anesthesia Quick Evaluation

## 2013-05-11 NOTE — Transfer of Care (Signed)
Immediate Anesthesia Transfer of Care Note  Patient: Tyler Guerrero  Procedure(s) Performed: Procedure(s): LEFT KNEE ARTHROSCOPY WITH MEDIAL MENISECTOMY (Left)  Patient Location: PACU  Anesthesia Type:General  Level of Consciousness: awake and alert   Airway & Oxygen Therapy: Patient Spontanous Breathing and Patient connected to nasal cannula oxygen  Post-op Assessment: Report given to PACU RN, Post -op Vital signs reviewed and stable and Patient moving all extremities X 4  Post vital signs: Reviewed and stable  Complications: No apparent anesthesia complications

## 2013-05-11 NOTE — Anesthesia Postprocedure Evaluation (Signed)
Anesthesia Post Note  Patient: Tyler Guerrero  Procedure(s) Performed: Procedure(s) (LRB): LEFT KNEE ARTHROSCOPY WITH MEDIAL MENISECTOMY (Left)  Anesthesia type: general  Patient location: PACU  Post pain: Pain level controlled  Post assessment: Patient's Cardiovascular Status Stable  Last Vitals:  Filed Vitals:   05/11/13 1700  BP:   Pulse: 80  Temp: 36.8 C  Resp: 13    Post vital signs: Reviewed and stable  Level of consciousness: sedated  Complications: No apparent anesthesia complications

## 2013-05-12 NOTE — Op Note (Signed)
NAME:  Tyler, Guerrero NO.:  [id number]  MEDICAL RECORD NO.:  [id number]  LOCATION:  [facility]                         FACILITY:  [facility]  PHYSICIAN:  Almedia Balls. Ranell Patrick, M.D. DATE OF BIRTH:  10-13-1975  DATE OF PROCEDURE:  05/11/2013 DATE OF DISCHARGE:  05/11/2013                              OPERATIVE REPORT   PREOPERATIVE DIAGNOSIS:  Left knee medial meniscus tear.  POSTOPERATIVE DIAGNOSIS:  Left knee medial meniscus tear.  PROCEDURE PERFORMED:  Left knee arthroscopy with partial medial meniscectomy.  ATTENDING SURGEON:  Verlee Rossetti, MD.  ASSISTANT:  None.  ANESTHESIA:  LMA general anesthesia was used.  ESTIMATED BLOOD LOSS:  Minimal.  FLUID REPLACEMENT:  1000 mL crystalloid.  INSTRUMENT COUNTS:  Correct.  There were no complications.  Perioperative antibiotics were given.  INDICATIONS:  The patient is a 37 year old male with worsening left medial knee pain after twisting injury to his knee.  He has had persistent pain despite conservative management.  Clinically, the patient has a torn meniscus.  We have counseled the patient regarding options for management.  He would like to proceed with knee arthroscopy. Risks and benefits were discussed.  Informed consent obtained.  DESCRIPTION OF PROCEDURE:  After adequate level of anesthesia was achieved, the patient was positioned supine on the operating room table. Left leg correctly identified and placed in a lateral post was utilized. Sterile prep and drape performed.  Time-out was called.  We entered the knee in standard portals including superolateral outflow, anterolateral scope and anteromedial working portals.  We identified significant synovitis throughout the knee.  The patellofemoral articular cartilage had minimal chondromalacia not requiring chondroplasty.  The medial and lateral gutters free of loose bodies.  The ACL and PCL were intact in the notch.  The medial compartment entered.  There was  an undersurface meniscal tear in the posterior horn that was about a cm half to 2 cm in length.  I did take a probe and then pulled it forward.  It was a little unstable.  The remainder of the meniscus was probed and visualized and felt to be normal meniscal roots intact, performed a partial meniscectomy removing the unstable portion of the meniscus, which was about 30% of the post horn of the meniscus.  The remaining meniscus was stable.  The lateral compartment entered.  There was no significant lateral meniscal tear identified.  Articular cartilage looked great.  Minimal softness in lateral compartment, and overall I thought the articular cartilage looked great.  Following completion of meniscectomy concluded surgery suturing wounds with 4-0 Monocryl followed by Steri-Strips and sterile compressive bandage, the patient tolerated the surgery well.     Almedia Balls. Ranell Patrick, M.D.     SRN/MEDQ  D:  05/11/2013  T:  05/12/2013  Job:  [id number]

## 2013-05-15 ENCOUNTER — Encounter (HOSPITAL_COMMUNITY): Payer: Self-pay | Admitting: Orthopedic Surgery

## 2013-06-24 ENCOUNTER — Emergency Department (HOSPITAL_COMMUNITY)
Admission: EM | Admit: 2013-06-24 | Discharge: 2013-06-24 | Disposition: A | Discharge status: Home / Self Care | Payer: Medicaid Other | Attending: Emergency Medicine | Admitting: Emergency Medicine

## 2013-06-24 ENCOUNTER — Encounter (HOSPITAL_COMMUNITY): Payer: Self-pay | Admitting: Emergency Medicine

## 2013-06-24 ENCOUNTER — Emergency Department (HOSPITAL_COMMUNITY): Payer: Medicaid Other

## 2013-06-24 DIAGNOSIS — M545 Low back pain, unspecified: Secondary | ICD-10-CM

## 2013-06-24 DIAGNOSIS — Z8659 Personal history of other mental and behavioral disorders: Secondary | ICD-10-CM | POA: Insufficient documentation

## 2013-06-24 DIAGNOSIS — Y9389 Activity, other specified: Secondary | ICD-10-CM | POA: Insufficient documentation

## 2013-06-24 DIAGNOSIS — F17200 Nicotine dependence, unspecified, uncomplicated: Secondary | ICD-10-CM | POA: Insufficient documentation

## 2013-06-24 DIAGNOSIS — IMO0002 Reserved for concepts with insufficient information to code with codable children: Secondary | ICD-10-CM | POA: Insufficient documentation

## 2013-06-24 DIAGNOSIS — Z87442 Personal history of urinary calculi: Secondary | ICD-10-CM | POA: Insufficient documentation

## 2013-06-24 DIAGNOSIS — Z9889 Other specified postprocedural states: Secondary | ICD-10-CM | POA: Insufficient documentation

## 2013-06-24 DIAGNOSIS — S43006A Unspecified dislocation of unspecified shoulder joint, initial encounter: Secondary | ICD-10-CM | POA: Insufficient documentation

## 2013-06-24 DIAGNOSIS — Y92410 Unspecified street and highway as the place of occurrence of the external cause: Secondary | ICD-10-CM | POA: Insufficient documentation

## 2013-06-24 DIAGNOSIS — Z862 Personal history of diseases of the blood and blood-forming organs and certain disorders involving the immune mechanism: Secondary | ICD-10-CM | POA: Insufficient documentation

## 2013-06-24 DIAGNOSIS — S43004A Unspecified dislocation of right shoulder joint, initial encounter: Secondary | ICD-10-CM

## 2013-06-24 MED ORDER — TRAMADOL HCL 50 MG PO TABS: 50.0000 mg | ORAL_TABLET | Freq: Four times a day (QID) | ORAL | Status: DC | PRN

## 2013-06-24 MED ORDER — CYCLOBENZAPRINE HCL 10 MG PO TABS: 10.0000 mg | ORAL_TABLET | Freq: Two times a day (BID) | ORAL | Status: DC | PRN
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
## 2013-06-24 NOTE — ED Notes (Signed)
PT ambulated with baseline gait; VSS; A&Ox3; no signs of distress; respirations even and unlabored; skin warm and dry; no questions upon discharge.  
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
## 2013-06-25 NOTE — ED Provider Notes (Signed)
Medical screening examination/treatment/procedure(s) were performed by non-physician practitioner and as supervising physician I was immediately available for consultation/collaboration.  EKG Interpretation   None         Laray AngerKathleen M Damyn Weitzel, DO 06/25/13 1023

## 2013-07-12 ENCOUNTER — Emergency Department (HOSPITAL_BASED_OUTPATIENT_CLINIC_OR_DEPARTMENT_OTHER)
Admission: EM | Admit: 2013-07-12 | Discharge: 2013-07-12 | Disposition: A | Discharge status: Home / Self Care | Payer: Medicaid Other | Attending: Emergency Medicine | Admitting: Emergency Medicine

## 2013-07-12 ENCOUNTER — Encounter (HOSPITAL_BASED_OUTPATIENT_CLINIC_OR_DEPARTMENT_OTHER): Payer: Self-pay | Admitting: Emergency Medicine

## 2013-07-12 DIAGNOSIS — M25519 Pain in unspecified shoulder: Secondary | ICD-10-CM | POA: Insufficient documentation

## 2013-07-12 DIAGNOSIS — G8929 Other chronic pain: Secondary | ICD-10-CM | POA: Insufficient documentation

## 2013-07-12 DIAGNOSIS — Z87828 Personal history of other (healed) physical injury and trauma: Secondary | ICD-10-CM | POA: Insufficient documentation

## 2013-07-12 DIAGNOSIS — Z79899 Other long term (current) drug therapy: Secondary | ICD-10-CM | POA: Insufficient documentation

## 2013-07-12 DIAGNOSIS — M25511 Pain in right shoulder: Secondary | ICD-10-CM

## 2013-07-12 DIAGNOSIS — Z862 Personal history of diseases of the blood and blood-forming organs and certain disorders involving the immune mechanism: Secondary | ICD-10-CM | POA: Insufficient documentation

## 2013-07-12 DIAGNOSIS — F3289 Other specified depressive episodes: Secondary | ICD-10-CM | POA: Insufficient documentation

## 2013-07-12 DIAGNOSIS — F329 Major depressive disorder, single episode, unspecified: Secondary | ICD-10-CM | POA: Insufficient documentation

## 2013-07-12 DIAGNOSIS — F17200 Nicotine dependence, unspecified, uncomplicated: Secondary | ICD-10-CM | POA: Insufficient documentation

## 2013-07-12 DIAGNOSIS — Z9889 Other specified postprocedural states: Secondary | ICD-10-CM | POA: Insufficient documentation

## 2013-07-12 DIAGNOSIS — Z87442 Personal history of urinary calculi: Secondary | ICD-10-CM | POA: Insufficient documentation

## 2013-07-12 MED ORDER — OXYCODONE HCL 5 MG PO TABS: 5.0000 mg | ORAL_TABLET | ORAL | Status: DC | PRN

## 2013-07-12 NOTE — Discharge Instructions (Signed)
   Shoulder Pain The shoulder is the joint that connects your arms to your body. The bones that form the shoulder joint include the upper arm bone (humerus), the shoulder blade (scapula), and the collarbone (clavicle). The top of the humerus is shaped like a ball and fits into a rather flat socket on the scapula (glenoid cavity). A combination of muscles and strong, fibrous tissues that connect muscles to bones (tendons) support your shoulder joint and hold the ball in the socket. Small, fluid-filled sacs (bursae) are located in different areas of the joint. They act as cushions between the bones and the overlying soft tissues and help reduce friction between the gliding tendons and the bone as you move your arm. Your shoulder joint allows a wide range of motion in your arm. This range of motion allows you to do things like scratch your back or throw a ball. However, this range of motion also makes your shoulder more prone to pain from overuse and injury. Causes of shoulder pain can originate from both injury and overuse and usually can be grouped in the following four categories:  Redness, swelling, and pain (inflammation) of the tendon (tendinitis) or the bursae (bursitis).  Instability, such as a dislocation of the joint.  Inflammation of the joint (arthritis).  Broken bone (fracture). HOME CARE INSTRUCTIONS   Apply ice to the sore area.  Put ice in a plastic bag.  Place a towel between your skin and the bag.  Leave the ice on for 15-20 minutes, 03-04 times per day for the first 2 days.  Stop using cold packs if they do not help with the pain.  If you have a shoulder sling or immobilizer, wear it as long as your caregiver instructs. Only remove it to shower or bathe. Move your arm as little as possible, but keep your hand moving to prevent swelling.  Squeeze a soft ball or foam pad as much as possible to help prevent swelling.  Only take over-the-counter or prescription medicines for  pain, discomfort, or fever as directed by your caregiver. SEEK MEDICAL CARE IF:   Your shoulder pain increases, or new pain develops in your arm, hand, or fingers.  Your hand or fingers become cold and numb.  Your pain is not relieved with medicines. SEEK IMMEDIATE MEDICAL CARE IF:   Your arm, hand, or fingers are numb or tingling.  Your arm, hand, or fingers are significantly swollen or turn white or blue. MAKE SURE YOU:   Understand these instructions.  Will watch your condition.  Will get help right away if you are not doing well or get worse. Document Released: 03/03/2005 Document Revised: 02/16/2012 Document Reviewed: 05/08/2011 [employer] Patient Information 2014 [employer].  

## 2013-07-12 NOTE — ED Provider Notes (Signed)
CSN: [id number]     Arrival date & time 07/12/13  2304 History   First MD Initiated Contact with Patient 07/12/13 2317     Chief Complaint  Patient presents with  . Shoulder Pain   (Consider location/radiation/quality/duration/timing/severity/associated sxs/prior Treatment) HPI Patient has chronic right shoulder pain. He seen a orthopedist for this and told that he needs a joint replacement. Shoulder pain is worse in the last 2 weeks. He is taking Ultram and ibuprofen at home with little relief. He has pain with movement and palpation. He has no new swelling or redness. He has no numbness or tingling. He has no new weakness. He denies any trauma to the area. He is asking for stronger pain medication until he can see his orthopedist again. Past Medical History  Diagnosis Date  . Labral tear of shoulder right shoulder  . Tingling occasional right arm  . Kidney stone   . Pseudocholinesterase deficiency   . Complication of anesthesia     Pseudocholinesterase deficiency diagnosed 2013  . Depression     not recently  . Cough     smoker's cough   Past Surgical History  Procedure Laterality Date  . Knee arthroscopy      RIGHT KNEE 2012   &  LEFT KNE  2009  . Laparoscopic cholecystectomy  01-29-2002  . Shoulder arthroscopy  1992    RIGHT  . 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 Shoulder arthroscopy  11/03/2011    Procedure: ARTHROSCOPY SHOULDER;  Surgeon: Javier Docker, MD;  Location: [facility];  Service: Orthopedics;  Laterality: Right;  with labrial debridement  . Joint replacement    . Knee arthroscopy Left 05/11/2013    Procedure: LEFT KNEE ARTHROSCOPY WITH MEDIAL MENISECTOMY;  Surgeon: Verlee Rossetti, MD;  Location: [facility] OR;  Service: Orthopedics;  Laterality: Left;   History reviewed. No pertinent family history. History  Substance Use Topics  . Smoking status: Current Every Day Smoker -- 1.50 packs/day for 15 years    Types: Cigarettes  . Smokeless tobacco: Never Used  .  Alcohol Use: No    Review of Systems  Constitutional: Negative for fever and chills.  Musculoskeletal: Positive for arthralgias. Negative for joint swelling, myalgias, neck pain and neck stiffness.  Skin: Negative for color change, rash and wound.  Neurological: Negative for weakness and numbness.  All other systems reviewed and are negative.    Allergies  Succinylcholine and Tylenol  Home Medications   Current Outpatient Rx  Name  Route  Sig  Dispense  Refill  . cyclobenzaprine (FLEXERIL) 10 MG tablet   Oral   Take 1 tablet (10 mg total) by mouth 2 (two) times daily as needed for muscle spasms.   20 tablet   0   . ibuprofen (ADVIL,MOTRIN) 800 MG tablet   Oral   Take 800 mg by mouth 2 (two) times daily as needed (pain).         . naproxen sodium (ANAPROX) 220 MG tablet   Oral   Take 440 mg by mouth 3 (three) times daily as needed (pain).         Marland Kitchen oxyCODONE (OXY IR/ROXICODONE) 5 MG immediate release tablet   Oral   Take 1 tablet (5 mg total) by mouth every 4 (four) hours as needed for severe pain.   10 tablet   0   . traMADol (ULTRAM) 50 MG tablet   Oral   Take 1 tablet (50 mg total) by mouth every 6 (  six) hours as needed.   15 tablet   0    BP 152/96  Pulse 84  Temp(Src) 98.8 F (37.1 C) (Oral)  Resp 16  Ht 6' (1.829 m)  Wt 350 lb (158.759 kg)  BMI 47.46 kg/m2  SpO2 100% Physical Exam  Nursing note and vitals reviewed. Constitutional: He is oriented to person, place, and time. He appears well-developed and well-nourished. No distress.  HENT:  Head: Normocephalic and atraumatic.  Mouth/Throat: Oropharynx is clear and moist.  Eyes: EOM are normal. Pupils are equal, round, and reactive to light.  Neck: Normal range of motion. Neck supple.  Cardiovascular: Normal rate.   Pulmonary/Chest: Effort normal.  Abdominal: Soft. Bowel sounds are normal.  Musculoskeletal: Normal range of motion. He exhibits tenderness. He exhibits no edema.  Tenderness to  palpation over the distal clavicle and lateral surface of the right shoulder. Patient has pain with range of motion. There is no definite crepitance. Patient has no warmth or deformity. There is no appreciable effusion. Patient has 2+ radial pulses.  Neurological: He is alert and oriented to person, place, and time.  Patient with 5/5 strength of right shoulder abduction. He has good right sided grip strength. Sensation is fully intact.  Skin: Skin is warm and dry. No rash noted. No erythema.  Psychiatric: He has a normal mood and affect. His behavior is normal.    ED Course  Procedures (including critical care time) Labs Review Labs Reviewed - No data to display Imaging Review No results found.  EKG Interpretation   None       MDM   1. Chronic right shoulder pain    Give a short supply of narcotic pain medication until the patient can be seen by his orthopedist. I also referred him to a sports medicine doctor. Return precautions have been given the patient has voice understanding    Loren Raceravid Jerlyn Pain, MD 07/12/13 [phone]

## 2013-07-12 NOTE — ED Notes (Signed)
Pt states he has been taking robaxin, ultram, and ibuprofen with no relief.

## 2013-07-12 NOTE — ED Notes (Signed)
MD at bedside. 

## 2013-07-12 NOTE — ED Notes (Signed)
Pt c/o cont right shoulder pain since 1/18 unable to see ortho

## 2013-07-19 ENCOUNTER — Encounter: Payer: Self-pay | Admitting: Family Medicine

## 2013-07-19 ENCOUNTER — Ambulatory Visit (INDEPENDENT_AMBULATORY_CARE_PROVIDER_SITE_OTHER): Payer: Medicaid Other | Admitting: Family Medicine

## 2013-07-19 VITALS — BP 149/91 | HR 93 | Ht 72.0 in | Wt 350.0 lb

## 2013-07-19 DIAGNOSIS — M25519 Pain in unspecified shoulder: Secondary | ICD-10-CM

## 2013-07-19 DIAGNOSIS — M25511 Pain in right shoulder: Secondary | ICD-10-CM

## 2013-07-19 DIAGNOSIS — G8929 Other chronic pain: Secondary | ICD-10-CM

## 2013-07-19 MED ORDER — NITROGLYCERIN 0.2 MG/HR TD PT24: MEDICATED_PATCH | TRANSDERMAL | Status: DC

## 2013-07-19 NOTE — Patient Instructions (Signed)
Try nitro patches 1/4th of a patch over affected shoulder, change daily. Can consider PRP injections - I believe [facility] [place] [facility]. Otherwise there aren't many other options besides replacement for you. Follow up with me in 6 weeks.

## 2013-07-24 ENCOUNTER — Encounter: Payer: Self-pay | Admitting: Family Medicine

## 2013-07-24 DIAGNOSIS — M25511 Pain in right shoulder: Secondary | ICD-10-CM

## 2013-07-24 DIAGNOSIS — G8929 Other chronic pain: Secondary | ICD-10-CM | POA: Insufficient documentation

## 2013-07-24 NOTE — Progress Notes (Signed)
Patient ID: Leanora Ivanoffnthony M Fitch, male   DOB: 03/15/1976, 38 y.o.   MRN: [id number]  PCP: Lolita PatellaEADE,ROBERT ALEXANDER, MD  Subjective:   HPI: Patient is a 38 y.o. male here for right shoulder pain.  Patient reports right shoulder pain initially started back in 1995. At that time he had subluxations, dislocation of this shoulder and had limited motion. He ended up having surgery which resolved the issue. Then it started hurting again in 2012. He tried PT for several months, about 5 cortisone injections, tramadol, robaxin, ibuprofen and other medications. Eventually had shoulder arthroscopy, debridement in 2013 by Dr. Shelle IronBeane. He has continued to have pain even with this surgery. Pain worse with overhead and all motions of this shoulder. No numbness/tingling.  Past Medical History  Diagnosis Date  . Labral tear of shoulder right shoulder  . Tingling occasional right arm  . Kidney stone   . Pseudocholinesterase deficiency   . Complication of anesthesia     Pseudocholinesterase deficiency diagnosed 2013  . Depression     not recently  . Cough     smoker's cough    Current Outpatient Prescriptions on File Prior to Visit  Medication Sig Dispense Refill  . ibuprofen (ADVIL,MOTRIN) 800 MG tablet Take 800 mg by mouth 2 (two) times daily as needed (pain).      . traMADol (ULTRAM) 50 MG tablet Take 1 tablet (50 mg total) by mouth every 6 (six) hours as needed.  15 tablet  0   No current facility-administered medications on file prior to visit.    Past Surgical History  Procedure Laterality Date  . Knee arthroscopy      RIGHT KNEE 2012   &  LEFT KNE  2009  . Laparoscopic cholecystectomy  01-29-2002  . Shoulder arthroscopy  1992    RIGHT  . 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 Shoulder arthroscopy  11/03/2011    Procedure: ARTHROSCOPY SHOULDER;  Surgeon: Javier DockerJeffrey C Beane, MD;  [facility] [place];  Service: Orthopedics;  Laterality: Right;  with labrial debridement  . Joint replacement     . Knee arthroscopy Left 05/11/2013    Procedure: LEFT KNEE ARTHROSCOPY WITH MEDIAL MENISECTOMY;  Surgeon: Verlee RossettiSteven R Norris, MD;  [facility];  Service: Orthopedics;  Laterality: Left;    Allergies  Allergen Reactions  . Succinylcholine     Pseudocholinesterase Deficiency  . Tylenol [Acetaminophen] Other (See Comments)    Should not take due to previous liver damage    History   Social History  . Marital Status: Legally Separated    Spouse Name: N/A    Number of Children: N/A  . Years of Education: N/A   Occupational History  . Not on file.   Social History Main Topics  . Smoking status: Current Every Day Smoker -- 1.50 packs/day for 15 years    Types: Cigarettes  . Smokeless tobacco: Never Used  . Alcohol Use: No  . Drug Use: No  . Sexual Activity: Yes    Birth Control/ Protection: Condom   Other Topics Concern  . Not on file   Social History Narrative  . No narrative on file    Family History  Problem Relation Age of Onset  . Hypertension Father   . Heart attack Father   . Hypertension Brother     BP 149/91  Pulse 93  Ht 6' (1.829 m)  Wt 350 lb (158.759 kg)  BMI 47.46 kg/m2  Review of Systems: See HPI above.  Objective:  Physical Exam:  Gen: NAD  Right shoulder: No swelling, ecchymoses.  No gross deformity. Mild ant and post tenderness. FROM with painful arc. Positive Hawkins, Neers. Negative Speeds, Yergasons. Strength 5/5 with empty can and resisted internal/external rotation.  Negative apprehension. NV intact distally.    Assessment & Plan:  1. Chronic right shoulder pain - exam is consistent with impingement.  Unfortunately despite most conservative measures (PT, injections, nsaids, other medications) and debridement he continues to have symptoms.  Per report MRI did not show anything else to account for his pain that wasn't addressed during surgery though the orthopedist told him replacement was only other option.  Advised he try  nitro patches 1/4th patch over affected shoulder and change daily.  F/u in 6 weeks.  Only other thing could consider are PRP injections.

## 2013-07-24 NOTE — Assessment & Plan Note (Signed)
exam is consistent with impingement.  Unfortunately despite most conservative measures (PT, injections, nsaids, other medications) and debridement he continues to have symptoms.  Per report MRI did not show anything else to account for his pain that wasn't addressed during surgery though the orthopedist told him replacement was only other option.  Advised he try nitro patches 1/4th patch over affected shoulder and change daily.  F/u in 6 weeks.  Only other thing could consider are PRP injections.

## 2013-08-30 ENCOUNTER — Ambulatory Visit: Payer: Self-pay | Admitting: Family Medicine

## 2013-10-07 ENCOUNTER — Emergency Department (HOSPITAL_BASED_OUTPATIENT_CLINIC_OR_DEPARTMENT_OTHER): Payer: Medicaid Other

## 2013-10-07 ENCOUNTER — Encounter (HOSPITAL_BASED_OUTPATIENT_CLINIC_OR_DEPARTMENT_OTHER): Payer: Self-pay | Admitting: Emergency Medicine

## 2013-10-07 ENCOUNTER — Emergency Department (HOSPITAL_BASED_OUTPATIENT_CLINIC_OR_DEPARTMENT_OTHER)
Admission: EM | Admit: 2013-10-07 | Discharge: 2013-10-07 | Disposition: A | Discharge status: Home / Self Care | Payer: Medicaid Other | Attending: Emergency Medicine | Admitting: Emergency Medicine

## 2013-10-07 DIAGNOSIS — Z79899 Other long term (current) drug therapy: Secondary | ICD-10-CM | POA: Insufficient documentation

## 2013-10-07 DIAGNOSIS — Z862 Personal history of diseases of the blood and blood-forming organs and certain disorders involving the immune mechanism: Secondary | ICD-10-CM | POA: Insufficient documentation

## 2013-10-07 DIAGNOSIS — Z8659 Personal history of other mental and behavioral disorders: Secondary | ICD-10-CM | POA: Insufficient documentation

## 2013-10-07 DIAGNOSIS — Z87828 Personal history of other (healed) physical injury and trauma: Secondary | ICD-10-CM | POA: Insufficient documentation

## 2013-10-07 DIAGNOSIS — N200 Calculus of kidney: Secondary | ICD-10-CM | POA: Insufficient documentation

## 2013-10-07 DIAGNOSIS — F17200 Nicotine dependence, unspecified, uncomplicated: Secondary | ICD-10-CM | POA: Insufficient documentation

## 2013-10-07 LAB — URINE MICROSCOPIC-ADD ON

## 2013-10-07 LAB — URINALYSIS, ROUTINE W REFLEX MICROSCOPIC
Bilirubin Urine: NEGATIVE
GLUCOSE, UA: NEGATIVE mg/dL
Hgb urine dipstick: NEGATIVE
KETONES UR: NEGATIVE mg/dL
LEUKOCYTES UA: NEGATIVE
NITRITE: NEGATIVE
PROTEIN: NEGATIVE mg/dL
Specific Gravity, Urine: 1.036 — ABNORMAL HIGH (ref 1.005–1.030)
UROBILINOGEN UA: 1 mg/dL (ref 0.0–1.0)
pH: 5 (ref 5.0–8.0)

## 2013-10-07 MED ORDER — TRAMADOL HCL 50 MG PO TABS
50.0000 mg | ORAL_TABLET | Freq: Four times a day (QID) | ORAL | Status: DC | PRN
Start: 2013-10-07 — End: 2015-04-27

## 2013-10-07 MED ORDER — METHOCARBAMOL 500 MG PO TABS
1000.0000 mg | ORAL_TABLET | Freq: Once | ORAL | Status: AC
  Administered 2013-10-07: 1000 mg via ORAL

## 2013-10-07 MED ORDER — KETOROLAC TROMETHAMINE 60 MG/2ML IM SOLN
60.0000 mg | Freq: Once | INTRAMUSCULAR | Status: AC
  Administered 2013-10-07: 60 mg via INTRAMUSCULAR
  Filled 2013-10-07: qty 2

## 2013-10-07 MED ORDER — TRAMADOL HCL 50 MG PO TABS
50.0000 mg | ORAL_TABLET | Freq: Once | ORAL | Status: AC
  Administered 2013-10-07: 50 mg via ORAL
  Filled 2013-10-07: qty 1

## 2013-10-07 MED ORDER — METHOCARBAMOL 500 MG PO TABS
ORAL_TABLET | ORAL | Status: AC
  Filled 2013-10-07: qty 2

## 2013-10-07 MED ORDER — TAMSULOSIN HCL 0.4 MG PO CAPS
0.4000 mg | ORAL_CAPSULE | Freq: Every day | ORAL | Status: DC
  Administered 2013-10-07: 0.4 mg via ORAL
  Filled 2013-10-07: qty 1

## 2013-10-07 MED ORDER — TAMSULOSIN HCL 0.4 MG PO CAPS: 0.4000 mg | ORAL_CAPSULE | Freq: Every day | ORAL | Status: DC

## 2013-10-07 NOTE — ED Provider Notes (Signed)
CSN: [id number]     Arrival date & time 10/07/13  0122 History   First MD Initiated Contact with Patient 10/07/13 [phone]     Chief Complaint  Patient presents with  . Flank Pain     (Consider location/radiation/quality/duration/timing/severity/associated sxs/prior Treatment) Patient is a 38 y.o. male presenting with flank pain. The history is provided by the patient.  Flank Pain This is a new problem. The current episode started 3 to 5 hours ago. The problem occurs constantly. The problem has not changed since onset.Pertinent negatives include no chest pain, no abdominal pain, no headaches and no shortness of breath. Nothing aggravates the symptoms. Nothing relieves the symptoms. He has tried nothing for the symptoms.  Prior to start of symptoms lifted his girlfriend over his shoulders and was carrying her around  Past Medical History  Diagnosis Date  . Labral tear of shoulder right shoulder  . Tingling occasional right arm  . Kidney stone   . Pseudocholinesterase deficiency   . Complication of anesthesia     Pseudocholinesterase deficiency diagnosed 2013  . Depression     not recently  . Cough     smoker's cough   Past Surgical History  Procedure Laterality Date  . Knee arthroscopy      RIGHT KNEE 2012   &  LEFT KNE  2009  . Laparoscopic cholecystectomy  01-29-2002  . Shoulder arthroscopy  1992    RIGHT  . 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 Shoulder arthroscopy  11/03/2011    Procedure: ARTHROSCOPY SHOULDER;  Surgeon: Javier DockerJeffrey C Beane, MD;  [facility] ;  Service: Orthopedics;  Laterality: Right;  with labrial debridement  . Joint replacement    . Knee arthroscopy Left 05/11/2013    Procedure: LEFT KNEE ARTHROSCOPY WITH MEDIAL MENISECTOMY;  Surgeon: Verlee RossettiSteven R Norris, MD;  [facility];  Service: Orthopedics;  Laterality: Left;   Family History  Problem Relation Age of Onset  . Hypertension Father   . Heart attack Father   . Hypertension Brother    History   Substance Use Topics  . Smoking status: Current Every Day Smoker -- 1.50 packs/day for 15 years    Types: Cigarettes  . Smokeless tobacco: Never Used  . Alcohol Use: No    Review of Systems  Constitutional: Negative for fever.  Respiratory: Negative for shortness of breath.   Cardiovascular: Negative for chest pain.  Gastrointestinal: Negative for nausea, vomiting and abdominal pain.  Genitourinary: Positive for flank pain. Negative for urgency, discharge, penile swelling and penile pain.  Neurological: Negative for headaches.  All other systems reviewed and are negative.     Allergies  Succinylcholine and Tylenol  Home Medications   Prior to Admission medications   Medication Sig Start Date End Date Taking? Authorizing Provider  ibuprofen (ADVIL,MOTRIN) 800 MG tablet Take 800 mg by mouth 2 (two) times daily as needed (pain).    Historical Provider, MD  nitroGLYCERIN (NITRODUR - DOSED IN MG/24 HR) 0.2 mg/hr patch Apply 1/4th patch to affected shoulder, change daily. 07/19/13   Lenda KelpShane R Hudnall, MD  traMADol (ULTRAM) 50 MG tablet Take 1 tablet (50 mg total) by mouth every 6 (six) hours as needed. 06/24/13   Junius FinnerErin O'Malley, PA-C   BP 156/87  Pulse 81  Temp(Src) 97.8 F (36.6 C) (Oral)  SpO2 98% Physical Exam  Constitutional: He is oriented to person, place, and time. He appears well-developed and well-nourished. No distress.  HENT:  Head: Normocephalic and atraumatic.  Mouth/Throat:  Oropharynx is clear and moist.  Eyes: Conjunctivae are normal. Pupils are equal, round, and reactive to light.  Neck: Normal range of motion. Neck supple.  Cardiovascular: Normal rate, regular rhythm and intact distal pulses.   Pulmonary/Chest: Effort normal and breath sounds normal. He has no wheezes. He has no rales.  Abdominal: Soft. Bowel sounds are normal. There is no tenderness. There is no rebound and no guarding.  Musculoskeletal: Normal range of motion.  Neurological: He is alert and  oriented to person, place, and time.  Skin: Skin is warm and dry.  Psychiatric: He has a normal mood and affect.    ED Course  Procedures (including critical care time) Labs Review Labs Reviewed  URINALYSIS, ROUTINE W REFLEX MICROSCOPIC - Abnormal; Notable for the following:    Color, Urine AMBER (*)    APPearance TURBID (*)    Specific Gravity, Urine 1.036 (*)    All other components within normal limits  URINE MICROSCOPIC-ADD ON - Abnormal; Notable for the following:    Crystals CA OXALATE CRYSTALS (*)    All other components within normal limits  BASIC METABOLIC PANEL    Imaging Review No results found.   EKG Interpretation None      MDM   Final diagnoses:  None    Kidney stone will treat with pain medication and flomax.  Strain all urine and call to be seen by urology in follow up.  Return for worsening symptoms    Saban Heinlen K Victoriah Wilds-Rasch, MD 10/07/13 [phone]

## 2013-10-07 NOTE — Discharge Instructions (Signed)
Diet for Kidney Stones  Kidney stones are small, hard masses that form inside your kidneys. They are made up of salts and minerals and often form when high levels build up in the urine. The minerals can then start to build up, crystalize, and stick together to form stones. There are several different types of kidney stones. The following types of stones may be influenced by dietary factors:   · Calcium Oxalate Stones. An oxalate is a salt found in certain foods. Within the body, calcium can combine with oxalates to form calcium oxalate stones, which can be excreted in the urine in high amounts. This is the most common type of kidney stone.  · Calcium Phosphate Stones. These stones may occur when the pH of the urine becomes too high, or less acidic, from too much calcium being excreted in the urine. The pH is a measure of how acidic or basic a substance is.  · Uric Acid Stones. This type of stone occurs when the pH of the urine becomes too low, or very acidic, because substances called purines build up in the urine. Purines are found in animal proteins. When the urine is highly concentrated with acid, uric acid kidney stones can form.    Other risk factors for kidney stones include genetics, environment, and being overweight. Your caregiver may ask you to follow specific diet guidelines based on the type of stone you have to lessen the chances of your body making more kidney stones.   GENERAL GUIDELINES FOR ALL TYPES OF STONES  · Drink plenty of fluid. Drink 12 16 cups of fluid a day, drinking mainly water. This is the most important thing you can do to prevent the formation of future kidney stones.  · Maintain a healthy weight. Your caregiver or dietitian can help you determine what a healthy weight is for you. If you are overweight, weight loss may help prevent the formation of future kidney stones.  · Eat a diet adequate in animal protein. Too much animal protein can contribute to the formation of stones. Your  dietitian can help you determine how much protein you should be eating. Avoid low carbohydrate, high protein diets.  · Follow a balanced eating approach. The DASH diet, which stands for "Dietary Approaches to Stop Hypertension," is an effective meal plan for reducing stone formation. This diet is high in fruits, vegetables, dairy, and whole grains and low in animal protein. Ask your caregiver or dietitian for information about the DASH diet.  ADDITIONAL DIET GUIDELINES FOR CALCIUM STONES  Avoid foods high in salt. This includes table salt, salt seasonings, MSG, soy sauce, cured and processed meats, salted crackers and snack foods, fast food, and canned soups and foods. Ask your caregiver or dietitian for information about reducing sodium in your diet or following the low sodium diet.   Ensure adequate calcium intake. Use the following table for calcium guidelines:  · Men 65 years old and younger  1000 mg/day.  · Men 65 years old and older  1500 mg/day.  · Women 25 37 years old  1000 mg/day.  · Women 50 years and older  1500 mg/day.  Your dietitian can help you determine if you are getting enough calcium in your diet. Foods that are high in calcium include dairy products, broccoli, cheese, yogurt, and pudding. If you need to take a calcium supplement, take it only in the form of calcium citrate.   Avoid foods high in oxalate. Be sure that any supplements you take do not   contain more than 500 mg of vitamin C. Vitamin C is converted into oxalate in the body. You do not need to avoid fruits and vegetables high in vitamin C.   · Grains: High-fiber or bran cereal, whole-wheat bread, grits, barley, buckwheat, amaranth, pretzels, and fruitcake.  · Vegetables: Dried beans, wax beans, dark leafy greens, eggplant, leeks, okra, parsley, rutabaga, tomato paste, watercress, zucchini, and escarole.  · Fruit: Dried apricots, red currants, figs, kiwi, and rhubarb.  · Meat and Meat Substitutes: Soybeans and foods made from soy  (soyburger, miso), dried beans, peanut butter.  · Milk: Chocolate milk mixes and soymilk.  · Fats and Oils: Nuts (peanuts, almonds, pecans, cashews, hazelnuts) and nut butters, sesame seeds, and tDahini paste.  · Condiments/Miscellaneous: Chocolate, carob, marmalade, poppy seeds, instant iced tea, and juice from high-oxalate fruits.     Document Released: 09/18/2010 Document Revised: 11/23/2011 Document Reviewed: 11/08/2011  [employer]® Patient Information ©2014 [employer].

## 2013-10-07 NOTE — ED Notes (Signed)
Pt c/o left flank pain that started around 3 hours ago. Describes as a dull pain. States pain is constant. Hx of kidney stones and states pain is the same. Denies n/v. Denies any fevers. C/o pain with urination

## 2013-11-25 ENCOUNTER — Encounter (HOSPITAL_BASED_OUTPATIENT_CLINIC_OR_DEPARTMENT_OTHER): Payer: Self-pay | Admitting: Emergency Medicine

## 2013-11-25 ENCOUNTER — Emergency Department (HOSPITAL_BASED_OUTPATIENT_CLINIC_OR_DEPARTMENT_OTHER)
Admission: EM | Admit: 2013-11-25 | Discharge: 2013-11-25 | Disposition: A | Discharge status: Home / Self Care | Payer: Medicaid Other | Attending: Emergency Medicine | Admitting: Emergency Medicine

## 2013-11-25 DIAGNOSIS — Z87442 Personal history of urinary calculi: Secondary | ICD-10-CM | POA: Insufficient documentation

## 2013-11-25 DIAGNOSIS — Z791 Long term (current) use of non-steroidal anti-inflammatories (NSAID): Secondary | ICD-10-CM | POA: Insufficient documentation

## 2013-11-25 DIAGNOSIS — Y9389 Activity, other specified: Secondary | ICD-10-CM | POA: Insufficient documentation

## 2013-11-25 DIAGNOSIS — X500XXA Overexertion from strenuous movement or load, initial encounter: Secondary | ICD-10-CM | POA: Insufficient documentation

## 2013-11-25 DIAGNOSIS — Z87828 Personal history of other (healed) physical injury and trauma: Secondary | ICD-10-CM | POA: Insufficient documentation

## 2013-11-25 DIAGNOSIS — Y929 Unspecified place or not applicable: Secondary | ICD-10-CM | POA: Insufficient documentation

## 2013-11-25 DIAGNOSIS — F17200 Nicotine dependence, unspecified, uncomplicated: Secondary | ICD-10-CM | POA: Insufficient documentation

## 2013-11-25 DIAGNOSIS — Z862 Personal history of diseases of the blood and blood-forming organs and certain disorders involving the immune mechanism: Secondary | ICD-10-CM | POA: Insufficient documentation

## 2013-11-25 DIAGNOSIS — IMO0002 Reserved for concepts with insufficient information to code with codable children: Secondary | ICD-10-CM | POA: Insufficient documentation

## 2013-11-25 DIAGNOSIS — F3289 Other specified depressive episodes: Secondary | ICD-10-CM | POA: Insufficient documentation

## 2013-11-25 DIAGNOSIS — F329 Major depressive disorder, single episode, unspecified: Secondary | ICD-10-CM | POA: Insufficient documentation

## 2013-11-25 DIAGNOSIS — Z79899 Other long term (current) drug therapy: Secondary | ICD-10-CM | POA: Insufficient documentation

## 2013-11-25 DIAGNOSIS — S56912A Strain of unspecified muscles, fascia and tendons at forearm level, left arm, initial encounter: Secondary | ICD-10-CM

## 2013-11-25 MED ORDER — OXYCODONE HCL 5 MG PO TABS: 5.0000 mg | ORAL_TABLET | ORAL | Status: DC | PRN

## 2013-11-25 MED ORDER — CYCLOBENZAPRINE HCL 10 MG PO TABS: 10.0000 mg | ORAL_TABLET | Freq: Three times a day (TID) | ORAL | Status: DC | PRN

## 2013-11-25 NOTE — ED Provider Notes (Signed)
CSN: [id number]     Arrival date & time 11/25/13  0016 History   First MD Initiated Contact with Patient 11/25/13 0141     Chief Complaint  Patient presents with  . Arm Injury     (Consider location/radiation/quality/duration/timing/severity/associated sxs/prior Treatment) HPI This is a 38 year old male who reached down to pick up a box yesterday evening about 8 PM. When he picked it up he felt a pop in his left antecubital fossa. He subsequently noticed swelling just distal to the antecubital fossa; this gradually resolved. He is now having pain in his in the proximal, volar soft tissue of the left forearm. Pain is worse with palpation or with attempted flexion at the wrist or fingers. There is no motor deficit or numbness. He rates his pain about an 8/10 with movement.  Past Medical History  Diagnosis Date  . Labral tear of shoulder right shoulder  . Tingling occasional right arm  . Kidney stone   . Pseudocholinesterase deficiency   . Complication of anesthesia     Pseudocholinesterase deficiency diagnosed 2013  . Depression     not recently  . Cough     smoker's cough   Past Surgical History  Procedure Laterality Date  . Knee arthroscopy      RIGHT KNEE 2012   &  LEFT KNE  2009  . Laparoscopic cholecystectomy  01-29-2002  . Shoulder arthroscopy  1992    RIGHT  . 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 Shoulder arthroscopy  11/03/2011    Procedure: ARTHROSCOPY SHOULDER;  Surgeon: Javier DockerJeffrey C Beane, MD;  [facility] [place];  Service: Orthopedics;  Laterality: Right;  with labrial debridement  . Joint replacement    . Knee arthroscopy Left 05/11/2013    Procedure: LEFT KNEE ARTHROSCOPY WITH MEDIAL MENISECTOMY;  Surgeon: Verlee RossettiSteven R Norris, MD;  [facility];  Service: Orthopedics;  Laterality: Left;   Family History  Problem Relation Age of Onset  . Hypertension Father   . Heart attack Father   . Hypertension Brother    History  Substance Use Topics  . Smoking status:  Current Every Day Smoker -- 1.50 packs/day for 15 years    Types: Cigarettes  . Smokeless tobacco: Never Used  . Alcohol Use: No    Review of Systems  All other systems reviewed and are negative.   Allergies  Succinylcholine; Ibuprofen; Toradol; and Tylenol  Home Medications   Prior to Admission medications   Medication Sig Start Date End Date Taking? Authorizing Kalis Friese  ibuprofen (ADVIL,MOTRIN) 800 MG tablet Take 800 mg by mouth 2 (two) times daily as needed (pain).    Historical Mallie Linnemann, MD  nitroGLYCERIN (NITRODUR - DOSED IN MG/24 HR) 0.2 mg/hr patch Apply 1/4th patch to affected shoulder, change daily. 07/19/13   Lenda KelpShane R Hudnall, MD  tamsulosin (FLOMAX) 0.4 MG CAPS capsule Take 1 capsule (0.4 mg total) by mouth daily. 10/07/13   April K Palumbo-Rasch, MD  traMADol (ULTRAM) 50 MG tablet Take 1 tablet (50 mg total) by mouth every 6 (six) hours as needed. 06/24/13   Junius FinnerErin O'Malley, PA-C  traMADol (ULTRAM) 50 MG tablet Take 1 tablet (50 mg total) by mouth every 6 (six) hours as needed. 10/07/13   April K Palumbo-Rasch, MD   BP 153/78  Pulse 85  Temp(Src) 98.6 F (37 C) (Oral)  Resp 16  SpO2 97%  Physical Exam General: Well-developed, well-nourished male in no acute distress; appearance consistent with age of record HENT: normocephalic; atraumatic Eyes: pupils  equal, round and reactive to light; extraocular muscles intact Neck: supple Heart: regular rate and rhythm Lungs: clear to auscultation bilaterally Abdomen: soft; nondistended; nontender; no masses or hepatosplenomegaly; bowel sounds present Extremities: No deformity; full range of motion; pulses normal; tenderness of the proximal soft tissues of the volar aspect of the left forearm; pain in left forearm with flexion at the wrist or fingers, especially against resistance; no appreciable edema of the left forearm; tendon function appears intact in the left wrist and fingers with intact sensation and brisk capillary  refill Neurologic: Awake, alert and oriented; motor function intact in all extremities and symmetric; no facial droop Skin: Warm and dry Psychiatric: Normal mood and affect    ED Course  Procedures (including critical care time)   MDM  [facility] and will follow up with them.    Hanley SeamenJohn L Molpus, MD 11/25/13 [phone]

## 2013-11-25 NOTE — ED Notes (Signed)
Pt reports pain in his left forearm after "feeling a pop" "while I was picking up a box"

## 2014-01-16 ENCOUNTER — Ambulatory Visit: Payer: Medicaid Other | Attending: Orthopedic Surgery | Admitting: Occupational Therapy

## 2014-04-07 ENCOUNTER — Emergency Department (HOSPITAL_COMMUNITY)
Admission: EM | Admit: 2014-04-07 | Discharge: 2014-04-07 | Disposition: A | Discharge status: Home / Self Care | Payer: Medicaid Other | Attending: Emergency Medicine | Admitting: Emergency Medicine

## 2014-04-07 ENCOUNTER — Encounter (HOSPITAL_COMMUNITY): Payer: Self-pay | Admitting: Emergency Medicine

## 2014-04-07 DIAGNOSIS — Z87828 Personal history of other (healed) physical injury and trauma: Secondary | ICD-10-CM | POA: Diagnosis not present

## 2014-04-07 DIAGNOSIS — Z202 Contact with and (suspected) exposure to infections with a predominantly sexual mode of transmission: Secondary | ICD-10-CM | POA: Diagnosis present

## 2014-04-07 DIAGNOSIS — Z87442 Personal history of urinary calculi: Secondary | ICD-10-CM | POA: Insufficient documentation

## 2014-04-07 DIAGNOSIS — F329 Major depressive disorder, single episode, unspecified: Secondary | ICD-10-CM | POA: Insufficient documentation

## 2014-04-07 DIAGNOSIS — Z8639 Personal history of other endocrine, nutritional and metabolic disease: Secondary | ICD-10-CM | POA: Insufficient documentation

## 2014-04-07 DIAGNOSIS — Z720 Tobacco use: Secondary | ICD-10-CM | POA: Insufficient documentation

## 2014-04-07 DIAGNOSIS — Z79899 Other long term (current) drug therapy: Secondary | ICD-10-CM | POA: Diagnosis not present

## 2014-04-07 MED ORDER — METRONIDAZOLE 500 MG PO TABS
2000.0000 mg | ORAL_TABLET | Freq: Once | ORAL | Status: AC
  Administered 2014-04-07: 2000 mg via ORAL
  Filled 2014-04-07: qty 4

## 2014-04-07 MED ORDER — ONDANSETRON 4 MG PO TBDP
4.0000 mg | ORAL_TABLET | Freq: Once | ORAL | Status: AC
  Administered 2014-04-07: 4 mg via ORAL
  Filled 2014-04-07: qty 1

## 2014-04-07 NOTE — ED Provider Notes (Signed)
CSN: [id number]     Arrival date & time 04/07/14  1558 History  This chart was scribed for non-physician practitioner, Emilia BeckKaitlyn Reyonna Haack, PA-C, working with Toy CookeyMegan Docherty, MD, by Modena JanskyAlbert Thayil, ED Scribe. This patient was seen in room WTR9/WTR9 and the patient's care was started at 5:49 PM.   Chief Complaint  Patient presents with  . Exposure to STD   The history is provided by the patient. No language interpreter was used.   HPI Comments: Leanora Ivanoffnthony M Tapper is a 38 y.o. male who presents to the Emergency Department complaining of a possible STD. He reports that his girlfriend recently tested positive for trigamous. He states that he has not been having any symptoms.   Past Medical History  Diagnosis Date  . Labral tear of shoulder right shoulder  . Tingling occasional right arm  . Kidney stone   . Pseudocholinesterase deficiency   . Complication of anesthesia     Pseudocholinesterase deficiency diagnosed 2013  . Depression     not recently  . Cough     smoker's cough   Past Surgical History  Procedure Laterality Date  . Knee arthroscopy      RIGHT KNEE 2012   &  LEFT KNE  2009  . Laparoscopic cholecystectomy  01-29-2002  . Shoulder arthroscopy  1992    RIGHT  . 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 Shoulder arthroscopy  11/03/2011    Procedure: ARTHROSCOPY SHOULDER;  Surgeon: Javier DockerJeffrey C Beane, MD;  [facility];  Service: Orthopedics;  Laterality: Right;  with labrial debridement  . Joint replacement    . Knee arthroscopy Left 05/11/2013    Procedure: LEFT KNEE ARTHROSCOPY WITH MEDIAL MENISECTOMY;  Surgeon: Verlee RossettiSteven R Norris, MD;  [facility];  Service: Orthopedics;  Laterality: Left;   Family History  Problem Relation Age of Onset  . Hypertension Father   . Heart attack Father   . Hypertension Brother    History  Substance Use Topics  . Smoking status: Current Every Day Smoker -- 1.50 packs/day for 15 years    Types: Cigarettes  . Smokeless tobacco: Never  Used  . Alcohol Use: No    Review of Systems  All other systems reviewed and are negative.   Allergies  Succinylcholine; Ibuprofen; Toradol; and Tylenol  Home Medications   Prior to Admission medications   Medication Sig Start Date End Date Taking? Authorizing Provider  ALPRAZolam Prudy Feeler(XANAX) 1 MG tablet Take 1 mg by mouth See admin instructions. Once daily. May taken an additional 2 tabs if needed.   Yes Historical Provider, MD  Oxycodone HCl 10 MG TABS Take 10 mg by mouth every 4 (four) hours as needed (pain).   Yes Historical Provider, MD  cyclobenzaprine (FLEXERIL) 10 MG tablet Take 1 tablet (10 mg total) by mouth 3 (three) times daily as needed for muscle spasms. 11/25/13   Carlisle BeersJohn L Molpus, MD  ibuprofen (ADVIL,MOTRIN) 800 MG tablet Take 800 mg by mouth 2 (two) times daily as needed (pain).    Historical Provider, MD  nitroGLYCERIN (NITRODUR - DOSED IN MG/24 HR) 0.2 mg/hr patch Apply 1/4th patch to affected shoulder, change daily. 07/19/13   Lenda KelpShane R Hudnall, MD  oxyCODONE (OXY IR/ROXICODONE) 5 MG immediate release tablet Take 1 tablet (5 mg total) by mouth every 4 (four) hours as needed (for pain). 11/25/13   Carlisle BeersJohn L Molpus, MD  tamsulosin (FLOMAX) 0.4 MG CAPS capsule Take 1 capsule (0.4 mg total) by mouth daily. 10/07/13  April K Palumbo-Rasch, MD  traMADol (ULTRAM) 50 MG tablet Take 1 tablet (50 mg total) by mouth every 6 (six) hours as needed. 06/24/13   Junius FinnerErin O'Malley, PA-C  traMADol (ULTRAM) 50 MG tablet Take 1 tablet (50 mg total) by mouth every 6 (six) hours as needed. 10/07/13   April K Palumbo-Rasch, MD   BP 140/83 mmHg  Pulse 73  Temp(Src) 98 F (36.7 C) (Oral)  Resp 18  SpO2 97% Physical Exam  Constitutional: He is oriented to person, place, and time. He appears well-developed and well-nourished. No distress.  HENT:  Head: Normocephalic and atraumatic.  Eyes: Conjunctivae are normal.  Neck: Normal range of motion.  Cardiovascular: Normal rate and regular rhythm.  Exam reveals no  gallop and no friction rub.   No murmur heard. Pulmonary/Chest: Effort normal and breath sounds normal. He has no wheezes. He has no rales. He exhibits no tenderness.  Abdominal: Soft. There is no tenderness.  Musculoskeletal: Normal range of motion.  Neurological: He is alert and oriented to person, place, and time. Coordination normal.  Speech is goal-oriented. Moves limbs without ataxia.   Skin: Skin is warm and dry.  Psychiatric: He has a normal mood and affect. His behavior is normal.  Nursing note and vitals reviewed.   ED Course  Procedures (including critical care time) DIAGNOSTIC STUDIES: Oxygen Saturation is 97% on RA, normal by my interpretation.    COORDINATION OF CARE: 5:53 PM- Pt advised of plan for treatment which includes medication and pt agrees.  Labs Review Labs Reviewed - No data to display  Imaging Review No results found.   EKG Interpretation None      MDM   Final diagnoses:  STD exposure    Patient treated here for trichomonas with 2g Flagyl. Vitals stable and patient afebrile.   I personally performed the services described in this documentation, which was scribed in my presence. The recorded information has been reviewed and is accurate.     Emilia BeckKaitlyn Latise Dilley, [place] JerseyPA-C 04/07/14 1848

## 2014-04-07 NOTE — Discharge Instructions (Signed)
You have been treated here for trichomonas. Refer to attached documents for more information.

## 2014-04-07 NOTE — ED Notes (Signed)
Pt here for STD check. States that partner was diagnosed with trich and wants to be tested for same.

## 2014-12-02 ENCOUNTER — Emergency Department (HOSPITAL_BASED_OUTPATIENT_CLINIC_OR_DEPARTMENT_OTHER)
Admission: EM | Admit: 2014-12-02 | Discharge: 2014-12-02 | Disposition: A | Discharge status: Home / Self Care | Payer: Medicaid Other | Attending: Emergency Medicine | Admitting: Emergency Medicine

## 2014-12-02 DIAGNOSIS — Z720 Tobacco use: Secondary | ICD-10-CM | POA: Diagnosis not present

## 2014-12-02 DIAGNOSIS — F329 Major depressive disorder, single episode, unspecified: Secondary | ICD-10-CM | POA: Diagnosis not present

## 2014-12-02 DIAGNOSIS — Z8781 Personal history of (healed) traumatic fracture: Secondary | ICD-10-CM | POA: Insufficient documentation

## 2014-12-02 DIAGNOSIS — Z9889 Other specified postprocedural states: Secondary | ICD-10-CM | POA: Insufficient documentation

## 2014-12-02 DIAGNOSIS — Z87442 Personal history of urinary calculi: Secondary | ICD-10-CM | POA: Diagnosis not present

## 2014-12-02 DIAGNOSIS — G8929 Other chronic pain: Secondary | ICD-10-CM | POA: Insufficient documentation

## 2014-12-02 DIAGNOSIS — M79672 Pain in left foot: Secondary | ICD-10-CM | POA: Diagnosis not present

## 2014-12-02 DIAGNOSIS — Z79899 Other long term (current) drug therapy: Secondary | ICD-10-CM | POA: Insufficient documentation

## 2014-12-02 DIAGNOSIS — M25511 Pain in right shoulder: Secondary | ICD-10-CM | POA: Diagnosis present

## 2014-12-02 NOTE — ED Notes (Signed)
Chronic pain to right shoulder and left foot.  Pt has injured both from the past.  Pt states he has increased pain recently.

## 2014-12-02 NOTE — Discharge Instructions (Signed)
Chronic Pain Contact Dr. Azucena CecilSwayne about referral to a pain management clinic. It is okay to take Advil as directed for pain. Ask Dr.  Azucena CecilSwayne  To  Recheck  Your blood pressure recheck within the next 3 weeks. Today's was mildly elevated at 154/90. Chronic pain can be defined as pain that is off and on and lasts for 3-6 months or longer. Many things cause chronic pain, which can make it difficult to make a diagnosis. There are many treatment options available for chronic pain. However, finding a treatment that works well for you may require trying various approaches until the right one is found. Many people benefit from a combination of two or more types of treatment to control their pain. SYMPTOMS  Chronic pain can occur anywhere in the body and can range from mild to very severe. Some types of chronic pain include:  Headache.  Low back pain.  Cancer pain.  Arthritis pain.  Neurogenic pain. This is pain resulting from damage to nerves. People with chronic pain may also have other symptoms such as:  Depression.  Anger.  Insomnia.  Anxiety. DIAGNOSIS  Your health care provider will help diagnose your condition over time. In many cases, the initial focus will be on excluding possible conditions that could be causing the pain. Depending on your symptoms, your health care provider may order tests to diagnose your condition. Some of these tests may include:   Blood tests.   CT scan.   MRI.   X-rays.   Ultrasounds.   Nerve conduction studies.  You may need to see a specialist.  TREATMENT  Finding treatment that works well may take time. You may be referred to a pain specialist. He or she may prescribe medicine or therapies, such as:   Mindful meditation or yoga.  Shots (injections) of numbing or pain-relieving medicines into the spine or area of pain.  Local electrical stimulation.  Acupuncture.   Massage therapy.   Aroma, color, light, or sound therapy.    Biofeedback.   Working with a physical therapist to keep from getting stiff.   Regular, gentle exercise.   Cognitive or behavioral therapy.   Group support.  Sometimes, surgery may be recommended.  HOME CARE INSTRUCTIONS   Take all medicines as directed by your health care provider.   Lessen stress in your life by relaxing and doing things such as listening to calming music.   Exercise or be active as directed by your health care provider.   Eat a healthy diet and include things such as vegetables, fruits, fish, and lean meats in your diet.   Keep all follow-up appointments with your health care provider.   Attend a support group with others suffering from chronic pain. SEEK MEDICAL CARE IF:   Your pain gets worse.   You develop a new pain that was not there before.   You cannot tolerate medicines given to you by your health care provider.   You have new symptoms since your last visit with your health care provider.  SEEK IMMEDIATE MEDICAL CARE IF:   You feel weak.   You have decreased sensation or numbness.   You lose control of bowel or bladder function.   Your pain suddenly gets much worse.   You develop shaking.  You develop chills.  You develop confusion.  You develop chest pain.  You develop shortness of breath.  MAKE SURE YOU:  Understand these instructions.  Will watch your condition.  Will get help right away if  you are not doing well or get worse. Document Released: 02/13/2002 Document Revised: 01/24/2013 Document Reviewed: 11/17/2012 [facility] Patient Information 2015 [place], [place]. This information is not intended to replace advice given to you by your health care provider. Make sure you discuss any questions you have with your health care provider.

## 2014-12-02 NOTE — ED Provider Notes (Signed)
CSN: [id number]     Arrival date & time 12/02/14  0908 History   First MD Initiated Contact with Patient 12/02/14 0914     No chief complaint on file.  Chief complaint right shoulder pain and left foot pain  (Consider location/radiation/quality/duration/timing/severity/associated sxs/prior Treatment) HPI Patient complains of right shoulder pain and left foot pain, lateral aspect . He's had right shoulder pain for 15 years since having had shoulder surgery and left foot pain for 17 years since he fractured his left foot. Pain became worse this weekend. No treatment prior to coming here. No other associated symptoms. Pain is worse with moving his shoulder, not improved by anything pain on left foot is worse with weightbearing, not improved by anything. No other associated symptoms. No treatment prior to coming here. No fever no trauma no other associated symptoms Past Medical History  Diagnosis Date  . Labral tear of shoulder right shoulder  . Tingling occasional right arm  . Kidney stone   . Pseudocholinesterase deficiency   . Complication of anesthesia     Pseudocholinesterase deficiency diagnosed 2013  . Depression     not recently  . Cough     smoker's cough   Past Surgical History  Procedure Laterality Date  . Knee arthroscopy      RIGHT KNEE 2012   &  LEFT KNE  2009  . Laparoscopic cholecystectomy  01-29-2002  . Shoulder arthroscopy  1992    RIGHT  . 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 Shoulder arthroscopy  11/03/2011    Procedure: ARTHROSCOPY SHOULDER;  Surgeon: Javier Docker, MD;  Location: [facility];  Service: Orthopedics;  Laterality: Right;  with labrial debridement  . Joint replacement    . Knee arthroscopy Left 05/11/2013    Procedure: LEFT KNEE ARTHROSCOPY WITH MEDIAL MENISECTOMY;  Surgeon: Verlee Rossetti, MD;  [facility];  Service: Orthopedics;  Laterality: Left;   Family History  Problem Relation Age of Onset  . Hypertension Father   . Heart attack  Father   . Hypertension Brother    History  Substance Use Topics  . Smoking status: Current Every Day Smoker -- 1.50 packs/day for 15 years    Types: Cigarettes  . Smokeless tobacco: Never Used  . Alcohol Use: No    Review of Systems  Constitutional: Negative.   HENT: Negative.   Respiratory: Negative.   Cardiovascular: Negative.   Gastrointestinal: Negative.   Musculoskeletal: Positive for arthralgias.  Skin: Negative.   Neurological: Negative.   Psychiatric/Behavioral: Negative.   All other systems reviewed and are negative.     Allergies  Succinylcholine; Ibuprofen; Toradol; and Tylenol  Home Medications   Prior to Admission medications   Medication Sig Start Date End Date Taking? Authorizing Provider  ALPRAZolam Prudy Feeler) 1 MG tablet Take 1 mg by mouth See admin instructions. Once daily. May taken an additional 2 tabs if needed.    Historical Provider, MD  cyclobenzaprine (FLEXERIL) 10 MG tablet Take 1 tablet (10 mg total) by mouth 3 (three) times daily as needed for muscle spasms. 11/25/13   John Molpus, MD  ibuprofen (ADVIL,MOTRIN) 800 MG tablet Take 800 mg by mouth 2 (two) times daily as needed (pain).    Historical Provider, MD  nitroGLYCERIN (NITRODUR - DOSED IN MG/24 HR) 0.2 mg/hr patch Apply 1/4th patch to affected shoulder, change daily. 07/19/13   Lenda Kelp, MD  oxyCODONE (OXY IR/ROXICODONE) 5 MG immediate release tablet Take 1 tablet (5 mg total) by  mouth every 4 (four) hours as needed (for pain). 11/25/13   John Molpus, MD  Oxycodone HCl 10 MG TABS Take 10 mg by mouth every 4 (four) hours as needed (pain).    Historical Provider, MD  tamsulosin (FLOMAX) 0.4 MG CAPS capsule Take 1 capsule (0.4 mg total) by mouth daily. 10/07/13   April Palumbo, MD  traMADol (ULTRAM) 50 MG tablet Take 1 tablet (50 mg total) by mouth every 6 (six) hours as needed. 06/24/13   Junius FinnerErin O'Malley, PA-C  traMADol (ULTRAM) 50 MG tablet Take 1 tablet (50 mg total) by mouth every 6 (six) hours  as needed. 10/07/13   April Palumbo, MD   BP 159/104 mmHg  Pulse 90  Temp(Src) 98.9 F (37.2 C) (Oral)  Resp 18  Ht 6' (1.829 m)  Wt 350 lb (158.759 kg)  BMI 47.46 kg/m2  SpO2 100% Physical Exam  Constitutional: He appears well-developed and well-nourished.  HENT:  Head: Normocephalic and atraumatic.  Eyes: Conjunctivae are normal. Pupils are equal, round, and reactive to light.  Neck: Neck supple. No tracheal deviation present. No thyromegaly present.  Cardiovascular: Normal rate and regular rhythm.   No murmur heard. Pulmonary/Chest: Effort normal and breath sounds normal.  Abdominal: Soft. Bowel sounds are normal. He exhibits no distension. There is no tenderness.  Morbidly obese  Musculoskeletal: Normal range of motion. He exhibits no edema or tenderness.  Right upper extremity no redness no swelling no point tenderness full range of motion with pain at shoulder on active motion. Radial pulse 2+. Left lower extremity no redness no swelling minimally tender at lateral aspect of foot. Deep pulse 2+.  Neurological: He is alert. No cranial nerve deficit. Coordination normal.  Walks with minimal limp favoring left foot  Skin: Skin is warm and dry. No rash noted.  Psychiatric: He has a normal mood and affect.  Nursing note and vitals reviewed.   ED Course  Procedures (including critical care time) Labs Review Labs Reviewed - No data to display  Imaging Review No results found.   EKG Interpretation None      MDM  He denies that he is allergic to ibuprofen. He reports that can take Advil, without difficulty. He reports that Toradol causes "liver cramps" Final diagnoses:  None   plan Advil as directed for pain. I suggest that he contact his primary care physician Dr.Swayne for referral to a pain management clinic, and to recheck blood pressure 3 weeks  Diagnosis#1 chronic pain  #2 elevated blood pressure   Doug SouSam Tynlee Bayle, MD 12/02/14 [phone]

## 2015-04-27 ENCOUNTER — Emergency Department (HOSPITAL_BASED_OUTPATIENT_CLINIC_OR_DEPARTMENT_OTHER)
Admission: EM | Admit: 2015-04-27 | Discharge: 2015-04-27 | Disposition: A | Discharge status: Home / Self Care | Payer: Medicaid Other | Attending: Emergency Medicine | Admitting: Emergency Medicine

## 2015-04-27 ENCOUNTER — Emergency Department (HOSPITAL_BASED_OUTPATIENT_CLINIC_OR_DEPARTMENT_OTHER): Payer: Medicaid Other

## 2015-04-27 DIAGNOSIS — Z8781 Personal history of (healed) traumatic fracture: Secondary | ICD-10-CM | POA: Insufficient documentation

## 2015-04-27 DIAGNOSIS — Z8659 Personal history of other mental and behavioral disorders: Secondary | ICD-10-CM | POA: Diagnosis not present

## 2015-04-27 DIAGNOSIS — Z8639 Personal history of other endocrine, nutritional and metabolic disease: Secondary | ICD-10-CM | POA: Diagnosis not present

## 2015-04-27 DIAGNOSIS — M79671 Pain in right foot: Secondary | ICD-10-CM | POA: Diagnosis not present

## 2015-04-27 DIAGNOSIS — Z87442 Personal history of urinary calculi: Secondary | ICD-10-CM | POA: Insufficient documentation

## 2015-04-27 DIAGNOSIS — F17210 Nicotine dependence, cigarettes, uncomplicated: Secondary | ICD-10-CM | POA: Diagnosis not present

## 2015-04-27 NOTE — ED Notes (Signed)
Patient here with right foot pain since last pm, denies injury but reports that he has had fracture x 2 to same

## 2015-04-27 NOTE — ED Notes (Signed)
Heard PA speaking with patient about chief complaints.

## 2015-04-27 NOTE — ED Provider Notes (Signed)
CSN: [id number]     Arrival date & time 04/27/15  1322 History   First MD Initiated Contact with Patient 04/27/15 1530     Chief Complaint  Patient presents with  . Foot Pain   HPI  Mr. Montez MoritaCarter is a 39 year old male presenting with foot pain. He states that the pain began when he stepped on it yesterday. He says that he was either sitting on the couch or laying in bed when he stood up and felt the acute onset of the pain. He denies injury to the foot. He states that he has had a foot fracture in the past and that this feels similar. The pain is located over the lateral aspect of the right midfoot. He denies swelling or loss of range of motion of the foot. He states he is able to ambulate though with moderate pain. He has not tried any over-the-counter pain relievers. Resting relieves the pain. He denies numbness, tingling or loss of sensation in the right foot. He has no other complaints at this time.  Past Medical History  Diagnosis Date  . Labral tear of shoulder right shoulder  . Tingling occasional right arm  . Kidney stone   . Pseudocholinesterase deficiency   . Complication of anesthesia     Pseudocholinesterase deficiency diagnosed 2013  . Depression     not recently  . Cough     smoker's cough   Past Surgical History  Procedure Laterality Date  . Knee arthroscopy      RIGHT KNEE 2012   &  LEFT KNE  2009  . Laparoscopic cholecystectomy  01-29-2002  . Shoulder arthroscopy  1992    RIGHT  . 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 Shoulder arthroscopy  11/03/2011    Procedure: ARTHROSCOPY SHOULDER;  Surgeon: Javier DockerJeffrey C Beane, MD;  [facility];  Service: Orthopedics;  Laterality: Right;  with labrial debridement  . Joint replacement    . Knee arthroscopy Left 05/11/2013    Procedure: LEFT KNEE ARTHROSCOPY WITH MEDIAL MENISECTOMY;  Surgeon: Verlee RossettiSteven R Norris, MD;  [facility];  Service: Orthopedics;  Laterality: Left;   Family History  Problem Relation Age of Onset    . Hypertension Father   . Heart attack Father   . Hypertension Brother    Social History  Substance Use Topics  . Smoking status: Current Every Day Smoker -- 1.50 packs/day for 15 years    Types: Cigarettes  . Smokeless tobacco: Never Used  . Alcohol Use: No    Review of Systems  Musculoskeletal: Positive for arthralgias.  All other systems reviewed and are negative.     Allergies  Succinylcholine; Tramadol; Ibuprofen; Toradol; and Tylenol  Home Medications   Prior to Admission medications   Not on File   BP 141/81 mmHg  Pulse 92  Temp(Src) 99.5 F (37.5 C) (Oral)  Resp 18  Ht 6' (1.829 m)  Wt 340 lb (154.223 kg)  BMI 46.10 kg/m2  SpO2 98% Physical Exam  Constitutional: He appears well-developed and well-nourished. No distress.  HENT:  Head: Normocephalic and atraumatic.  Eyes: Conjunctivae are normal. Right eye exhibits no discharge. Left eye exhibits no discharge. No scleral icterus.  Neck: Normal range of motion.  Cardiovascular: Normal rate, regular rhythm and intact distal pulses.   Pedal pulse palpable  Pulmonary/Chest: Effort normal. No respiratory distress.  Musculoskeletal: Normal range of motion. He exhibits tenderness. He exhibits no edema.       Feet:  Mild tenderness  to right lateral foot at the 5th metatarsal base. No obvious deformity or edema. Patient retains full range of motion of ankle and toes. He is able to walk with a steady gait.  Neurological: He is alert. Coordination normal.  5 out of 5 ankle strength bilaterally. Sensation to light touch intact.  Skin: Skin is warm and dry.  No ecchymosis, wound or other skin changes over the right foot.  Psychiatric: He has a normal mood and affect. His behavior is normal.  Nursing note and vitals reviewed.   ED Course  Procedures (including critical care time) Labs Review Labs Reviewed - No data to display  Imaging Review Dg Foot Complete Right  04/27/2015  CLINICAL DATA:  Right lateral  foot pain.  No known injury. EXAM: RIGHT FOOT COMPLETE - 3+ VIEW COMPARISON:  None. FINDINGS: There is no evidence of fracture or dislocation. Small enthesophyte at the Achilles insertion. Minimal dorsal spurring in the midfoot. Soft tissues are unremarkable. IMPRESSION: No significant abnormality. Electronically Signed   By: Francene Boyers M.D.   On: 04/27/2015 14:27   I have personally reviewed and evaluated these images and lab results as part of my medical decision-making.   EKG Interpretation None      MDM   Final diagnoses:  Right foot pain   Patient presenting with right foot pain. No known injury to the foot. Right foot is neurovascularly intact with FROM. Patient X-Ray negative for obvious fracture or dislocation.  Pt is able to ambulate with a steady gait. Conservative therapy recommended. Discussed RICE therapy and use of OTC pain relievers. After discussing this with patient and preparing his discharge paperwork, nurse informed me that he had eloped before receiving paperwork or final set of vitals.   Alveta Heimlich, PA-C 04/27/15 2149  Jerelyn Scott, MD 04/27/15 2153

## 2015-04-27 NOTE — ED Notes (Signed)
Went to round on patient and patient was not in stretcher and unable to locate in department. Charge RN aware.

## 2015-04-28 ENCOUNTER — Encounter (HOSPITAL_COMMUNITY): Payer: Self-pay

## 2015-04-28 ENCOUNTER — Emergency Department (HOSPITAL_COMMUNITY)
Admission: EM | Admit: 2015-04-28 | Discharge: 2015-04-28 | Disposition: A | Discharge status: Home / Self Care | Payer: Medicaid Other | Attending: Emergency Medicine | Admitting: Emergency Medicine

## 2015-04-28 DIAGNOSIS — F17210 Nicotine dependence, cigarettes, uncomplicated: Secondary | ICD-10-CM | POA: Insufficient documentation

## 2015-04-28 DIAGNOSIS — Z87828 Personal history of other (healed) physical injury and trauma: Secondary | ICD-10-CM | POA: Diagnosis not present

## 2015-04-28 DIAGNOSIS — Z8639 Personal history of other endocrine, nutritional and metabolic disease: Secondary | ICD-10-CM | POA: Diagnosis not present

## 2015-04-28 DIAGNOSIS — Z87442 Personal history of urinary calculi: Secondary | ICD-10-CM | POA: Insufficient documentation

## 2015-04-28 DIAGNOSIS — M79671 Pain in right foot: Secondary | ICD-10-CM | POA: Diagnosis present

## 2015-04-28 DIAGNOSIS — Z8659 Personal history of other mental and behavioral disorders: Secondary | ICD-10-CM | POA: Insufficient documentation

## 2015-04-28 NOTE — ED Provider Notes (Signed)
CSN: [id number]     Arrival date & time 04/28/15  1031 History   First MD Initiated Contact with Patient 04/28/15 1111     Chief Complaint  Patient presents with  . Foot Pain     HPI Patient reports increasing right-sided foot pain over the past 2 days.  He reports that he previously broke this right foot and it feels similar.  He denies new injury.  He reports he was seen last night at [facility] had x-rays done which were negative.  He complains of ongoing discomfort and pain.  No fevers or chills.  No redness noted.  No history DVT.  No numbness or tingling or weakness of his right lower extremity.  Symptoms are mild to moderate in severity  Past Medical History  Diagnosis Date  . Labral tear of shoulder right shoulder  . Tingling occasional right arm  . Kidney stone   . Pseudocholinesterase deficiency   . Complication of anesthesia     Pseudocholinesterase deficiency diagnosed 2013  . Depression     not recently  . Cough     smoker's cough   Past Surgical History  Procedure Laterality Date  . Knee arthroscopy      RIGHT KNEE 2012   &  LEFT KNE  2009  . Laparoscopic cholecystectomy  01-29-2002  . Shoulder arthroscopy  1992    RIGHT  . 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 Shoulder arthroscopy  11/03/2011    Procedure: ARTHROSCOPY SHOULDER;  Surgeon: Javier Docker, MD;  Location: [facility];  Service: Orthopedics;  Laterality: Right;  with labrial debridement  . Joint replacement    . Knee arthroscopy Left 05/11/2013    Procedure: LEFT KNEE ARTHROSCOPY WITH MEDIAL MENISECTOMY;  Surgeon: Verlee Rossetti, MD;  Location: [facility] OR;  Service: Orthopedics;  Laterality: Left;   Family History  Problem Relation Age of Onset  . Hypertension Father   . Heart attack Father   . Hypertension Brother    Social History  Substance Use Topics  . Smoking status: Current Every Day Smoker -- 1.50 packs/day for 15 years    Types: Cigarettes  . Smokeless tobacco: Never Used   . Alcohol Use: No    Review of Systems  All other systems reviewed and are negative.     Allergies  Succinylcholine; Tramadol; Ibuprofen; Toradol; and Tylenol  Home Medications   Prior to Admission medications   Not on File   BP 145/77 mmHg  Pulse 79  Temp(Src) 97.6 F (36.4 C) (Oral)  Resp 16  SpO2 100% Physical Exam  Constitutional: He is oriented to person, place, and time. He appears well-developed and well-nourished.  HENT:  Head: Normocephalic.  Eyes: EOM are normal.  Neck: Normal range of motion.  Pulmonary/Chest: Effort normal.  Abdominal: He exhibits no distension.  Musculoskeletal: Normal range of motion.  Normal pulses in right foot.  Compartments of right lower extremity are soft.  No unilateral leg swelling of the right leg as compared to left.  Full range of motion of right ankle.  Mild tenderness at the base of the right fifth metatarsal.  No obvious swelling.  No erythema, warmth, crepitus noted in his foot.  No obvious puncture wounds noted.  Neurological: He is alert and oriented to person, place, and time.  Psychiatric: He has a normal mood and affect.  Nursing note and vitals reviewed.   ED Course  Procedures (including critical care time) Labs Review Labs Reviewed -  No data to display  Imaging Review Dg Foot Complete Right  04/27/2015  CLINICAL DATA:  Right lateral foot pain.  No known injury. EXAM: RIGHT FOOT COMPLETE - 3+ VIEW COMPARISON:  None. FINDINGS: There is no evidence of fracture or dislocation. Small enthesophyte at the Achilles insertion. Minimal dorsal spurring in the midfoot. Soft tissues are unremarkable. IMPRESSION: No significant abnormality. Electronically Signed   By: Francene BoyersJames  Maxwell M.D.   On: 04/27/2015 14:27   I have personally reviewed and evaluated these images and lab results as part of my medical decision-making.   EKG Interpretation None      MDM   Final diagnoses:  Right foot pain    Pt given CAM walker.  Crutches. nonweight bearing status. Sports medicine follow up    Azalia BilisKevin Zakaiya Lares, MD 04/29/15 [phone]

## 2015-04-28 NOTE — ED Notes (Addendum)
Pt c/o increasing R foot pain x 2 days.  Pain score 5/10.  Pt reports breaking foot x 2 previously.  Sts "it's been years."  Denies injury.   [facility] and notes state that he eloped.

## 2015-04-28 NOTE — ED Notes (Signed)
Bed: [facility] Expected date:  Expected time:  Means of arrival:  Comments: 

## 2015-04-29 ENCOUNTER — Encounter: Payer: Self-pay | Admitting: Family Medicine

## 2015-04-29 ENCOUNTER — Ambulatory Visit (INDEPENDENT_AMBULATORY_CARE_PROVIDER_SITE_OTHER): Payer: Medicaid Other | Admitting: Family Medicine

## 2015-04-29 VITALS — BP 157/93 | HR 81 | Ht 72.0 in | Wt 340.0 lb

## 2015-04-29 DIAGNOSIS — M79671 Pain in right foot: Secondary | ICD-10-CM | POA: Diagnosis present

## 2015-04-29 MED ORDER — OXYCODONE HCL 5 MG PO TABS: 5.0000 mg | ORAL_TABLET | Freq: Four times a day (QID) | ORAL | Status: AC | PRN

## 2015-04-29 NOTE — Patient Instructions (Addendum)
Your exam is consistent with an unusual foot sprain. Ultrasound and x-rays are reassuring but can be normal the first 2 weeks after an injury/fracture. Icing 15 minutes at a time 3-4 times a day. Consider using the boot, sleeve, arch binder if these help with your pain. Oxycodone as needed for severe pain Follow up with me in 2 weeks unless your pain has resolved. Ok to bear weight as tolerated.

## 2015-05-06 DIAGNOSIS — M79671 Pain in right foot: Secondary | ICD-10-CM | POA: Insufficient documentation

## 2015-05-06 NOTE — Progress Notes (Signed)
PCP: Lolita PatellaEADE,ROBERT ALEXANDER, MD  Subjective:   HPI: Patient is a 39 y.o. male here for right foot pain.  Patient reports he got up to go to the mailbox on 11/19 and right foot started hurting dorsolaterally. No obvious injury though. By Sunday night couldn't sleep or stand due to pain here. Had a knot on outer side of foot. Tried icing/heat and cam walker. Pain is sharp, 3/10 level of pain, constant. Worse with ambulation. Radiographs negative for fracture. No skin changes, fever, other complaints.  Past Medical History  Diagnosis Date  . Labral tear of shoulder right shoulder  . Tingling occasional right arm  . Kidney stone   . Pseudocholinesterase deficiency   . Complication of anesthesia     Pseudocholinesterase deficiency diagnosed 2013  . Depression     not recently  . Cough     smoker's cough    No current outpatient prescriptions on file prior to visit.   No current facility-administered medications on file prior to visit.    Past Surgical History  Procedure Laterality Date  . Knee arthroscopy      RIGHT KNEE 2012   &  LEFT KNE  2009  . Laparoscopic cholecystectomy  01-29-2002  . Shoulder arthroscopy  1992    RIGHT  . 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 Shoulder arthroscopy  11/03/2011    Procedure: ARTHROSCOPY SHOULDER;  Surgeon: Javier DockerJeffrey C Beane, MD;  [facility];  Service: Orthopedics;  Laterality: Right;  with labrial debridement  . Joint replacement    . Knee arthroscopy Left 05/11/2013    Procedure: LEFT KNEE ARTHROSCOPY WITH MEDIAL MENISECTOMY;  Surgeon: Verlee RossettiSteven R Norris, MD;  [facility];  Service: Orthopedics;  Laterality: Left;    Allergies  Allergen Reactions  . Succinylcholine     Pseudocholinesterase Deficiency  . Tramadol Anaphylaxis  . Ibuprofen Swelling    "gums swell"  . Toradol [Ketorolac Tromethamine] Other (See Comments)    Causes liver problems and elevated liver enzymes  . Tylenol [Acetaminophen] Other (See  Comments)    Should not take due to previous liver damage    Social History   Social History  . Marital Status: Legally Separated    Spouse Name: N/A  . Number of Children: N/A  . Years of Education: N/A   Occupational History  . Not on file.   Social History Main Topics  . Smoking status: Current Every Day Smoker -- 1.50 packs/day for 15 years    Types: Cigarettes  . Smokeless tobacco: Never Used  . Alcohol Use: No  . Drug Use: No  . Sexual Activity: Yes    Birth Control/ Protection: Condom   Other Topics Concern  . Not on file   Social History Narrative    Family History  Problem Relation Age of Onset  . Hypertension Father   . Heart attack Father   . Hypertension Brother     BP 157/93 mmHg  Pulse 81  Ht 6' (1.829 m)  Wt 340 lb (154.223 kg)  BMI 46.10 kg/m2  Review of Systems: See HPI above.    Objective:  Physical Exam:  Gen: NAD  Right foot/ankle: No gross deformity, swelling, ecchymoses.  Pes planus. FROM with 5/5 strength all motions. TTP dorsolaterally base 3rd-5th metatarsals and TMT area. Negative ant drawer and talar tilt.   Negative syndesmotic compression. Thompsons test negative. NV intact distally.  Left foot/ankle: FROM without pain.  MSK u/s:  No evidence cortical irregularity, edema, neovascularity  of metatarsals to suggest fracture of right foot.    Assessment & Plan:  1. Right foot pain - consistent with sprain.  Icing, cam walker (advised can use arch binder instead or in addition to this).  Oxycodone for pain.  F/u in 2 weeks.  Weight bearing as tolerated.

## 2015-05-06 NOTE — Assessment & Plan Note (Signed)
consistent with sprain.  Icing, cam walker (advised can use arch binder instead or in addition to this).  Oxycodone for pain.  F/u in 2 weeks.  Weight bearing as tolerated.

## 2016-10-24 ENCOUNTER — Encounter (HOSPITAL_COMMUNITY): Payer: Self-pay | Admitting: *Deleted

## 2016-10-24 ENCOUNTER — Emergency Department (HOSPITAL_COMMUNITY)
Admission: EM | Admit: 2016-10-24 | Discharge: 2016-10-24 | Disposition: A | Discharge status: Home / Self Care | Payer: Medicaid Other | Attending: Emergency Medicine | Admitting: Emergency Medicine

## 2016-10-24 DIAGNOSIS — M25531 Pain in right wrist: Secondary | ICD-10-CM | POA: Diagnosis not present

## 2016-10-24 DIAGNOSIS — Y939 Activity, unspecified: Secondary | ICD-10-CM | POA: Insufficient documentation

## 2016-10-24 DIAGNOSIS — Y999 Unspecified external cause status: Secondary | ICD-10-CM | POA: Insufficient documentation

## 2016-10-24 DIAGNOSIS — Y929 Unspecified place or not applicable: Secondary | ICD-10-CM | POA: Insufficient documentation

## 2016-10-24 DIAGNOSIS — F17210 Nicotine dependence, cigarettes, uncomplicated: Secondary | ICD-10-CM | POA: Insufficient documentation

## 2016-10-24 DIAGNOSIS — S6991XA Unspecified injury of right wrist, hand and finger(s), initial encounter: Secondary | ICD-10-CM | POA: Diagnosis present

## 2016-10-24 DIAGNOSIS — X509XXA Other and unspecified overexertion or strenuous movements or postures, initial encounter: Secondary | ICD-10-CM | POA: Diagnosis not present

## 2016-10-24 MED ORDER — NAPROXEN 500 MG PO TABS: 500.0000 mg | ORAL_TABLET | Freq: Two times a day (BID) | ORAL | 0 refills | Status: DC

## 2016-10-24 NOTE — Discharge Instructions (Signed)
Wear brace for comfort and support Ice - ice for 20 minutes at a time, several times a day Naproxen - take with food. Take twice daily Follow up with Orthopedics

## 2016-10-24 NOTE — ED Triage Notes (Signed)
Pt complains of right wrist pain since helping his friend move furniture. Pt states he noticed the pain an hour after finishing. Pt is able to extend and flex wrist but states he has limited ROM in right wrist. Pt states his right grip strength feels weaker than usual.

## 2016-10-24 NOTE — ED Provider Notes (Signed)
[facility]-EMERGENCY DEPT Provider Note    By signing my name below, I, Earmon PhoenixJennifer Waddell, attest that this documentation has been prepared under the direction and in the presence of Terance HartKelly Abdulahi Schor, PA-C. Electronically Signed: Earmon PhoenixJennifer Waddell, ED Scribe. 10/24/16. 1:26 PM.    History   Chief Complaint Chief Complaint  Patient presents with  . Wrist Pain   The history is provided by the patient and medical records. No language interpreter was used.    Tyler Guerrero is a 41 y.o. male who presents to the Emergency Department complaining of right dorsal wrist pain that began yesterday. He reports associated improving swelling. He states he was lifting heavy things yesterday while moving. He has not taken anything for pain. Moving or palpating the area increases the pain. He denies alleviating factors. He denies wrist or thumb pain, bruising, wounds, numbness, tingling or weakness of the right hand or arm.   Past Medical History:  Diagnosis Date  . Complication of anesthesia    Pseudocholinesterase deficiency diagnosed 2013  . Cough    smoker's cough  . Depression    not recently  . Kidney stone   . Labral tear of shoulder right shoulder  . Pseudocholinesterase deficiency   . Tingling occasional right arm    Patient Active Problem List   Diagnosis Date Noted  . Right foot pain 05/06/2015  . Chronic right shoulder pain 07/24/2013    Past Surgical History:  Procedure Laterality Date  . 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 JOINT REPLACEMENT    . KNEE ARTHROSCOPY     RIGHT KNEE 2012   &  LEFT KNE  2009  . KNEE ARTHROSCOPY Left 05/11/2013   Procedure: LEFT KNEE ARTHROSCOPY WITH MEDIAL MENISECTOMY;  Surgeon: Verlee RossettiSteven R Norris, MD;  [facility];  Service: Orthopedics;  Laterality: Left;  . LAPAROSCOPIC CHOLECYSTECTOMY  01-29-2002  . SHOULDER ARTHROSCOPY  1992   RIGHT  . SHOULDER ARTHROSCOPY  11/03/2011   Procedure: ARTHROSCOPY SHOULDER;  Surgeon: Javier DockerJeffrey C Beane, MD;  [facility];  Service: Orthopedics;  Laterality: Right;  with labrial debridement       Home Medications    Prior to Admission medications   Medication Sig Start Date End Date Taking? Authorizing Provider  ALPRAZolam (XANAX) 1 MG tablet TAKE 1/2 -1 TAB BY MOUTH 2 TIMES A DAY AS NEEDED DAILY 02/11/15   [provider]  oxyCODONE (ROXICODONE) 5 MG immediate release tablet Take 1 tablet (5 mg total) by mouth every 6 (six) hours as needed for severe pain. 04/29/15   Lenda KelpHudnall, Shane R, MD    Family History Family History  Problem Relation Age of Onset  . Hypertension Father   . Heart attack Father   . Hypertension Brother     Social History Social History  Substance Use Topics  . Smoking status: Current Every Day Smoker    Packs/day: 1.50    Years: 15.00    Types: Cigarettes  . Smokeless tobacco: Never Used  . Alcohol use No     Allergies   Succinylcholine; Tramadol; Ibuprofen; Toradol [ketorolac tromethamine]; and Tylenol [acetaminophen]   Review of Systems Review of Systems  Musculoskeletal: Positive for arthralgias and joint swelling.  Skin: Negative for color change and wound.  Neurological: Negative for weakness and numbness.     Physical Exam Updated Vital Signs BP 132/84   Pulse 79   Temp 99.3 F (37.4 C) (Oral)   Resp 18   SpO2 98%   Physical  Exam  Constitutional: He is oriented to person, place, and time. He appears well-developed and well-nourished. No distress.  HENT:  Head: Normocephalic and atraumatic.  Eyes: Conjunctivae are normal. Pupils are equal, round, and reactive to light. Right eye exhibits no discharge. Left eye exhibits no discharge. No scleral icterus.  Neck: Normal range of motion.  Cardiovascular: Normal rate.   Pulmonary/Chest: Effort normal. No respiratory distress.  Abdominal: He exhibits no distension.  Musculoskeletal: Normal range of motion. He exhibits tenderness. He exhibits no edema or deformity.  No obvious  swelling or deformity. Full ROM of all fingers of right hand. Full flexion and extension of wrist. TTP over the radial aspect of right wrist. Positive Finklestein test.  Neurological: He is alert and oriented to person, place, and time.  NVI  Skin: Skin is warm and dry.  Psychiatric: He has a normal mood and affect. His behavior is normal.  Nursing note and vitals reviewed.   ED Treatments / Results  DIAGNOSTIC STUDIES: Oxygen Saturation is 98% on RA, normal by my interpretation.   COORDINATION OF CARE: 1:22 PM- Will prescribe NSAID and provide brace. Pt verbalizes understanding and agrees to plan.  Medications - No data to display  Labs (all labs ordered are listed, but only abnormal results are displayed) Labs Reviewed - No data to display  EKG  EKG Interpretation None       Radiology No results found.  Procedures Procedures (including critical care time)  Medications Ordered in ED Medications - No data to display   Initial Impression / Assessment and Plan / ED Course  I have reviewed the triage vital signs and the nursing notes.  Pertinent labs & imaging results that were available during my care of the patient were reviewed by me and considered in my medical decision making (see chart for details).  Patient presenting with right wrist pain that began yesterday secondary to lifting heavy items. Exam consistent with De Quervain's tenosynovitis. Pt advised to follow up with orthopedics. Patient given brace while in ED, conservative therapy recommended and discussed. Patient will be discharged home & is agreeable with above plan. Returns precautions discussed. Pt appears safe for discharge.   Final Clinical Impressions(s) / ED Diagnoses   Final diagnoses:  Right wrist pain    New Prescriptions New Prescriptions   No medications on file   I personally performed the services described in this documentation, which was scribed in my presence. The recorded  information has been reviewed and is accurate.     Bethel Born, PA-C 10/26/16 1610    Clarene Duke Ambrose Finland, MD 10/26/16 0700

## 2017-01-23 ENCOUNTER — Encounter (HOSPITAL_COMMUNITY): Payer: Self-pay | Admitting: *Deleted

## 2017-01-23 ENCOUNTER — Emergency Department (HOSPITAL_COMMUNITY): Payer: Medicaid Other

## 2017-01-23 ENCOUNTER — Emergency Department (HOSPITAL_COMMUNITY)
Admission: EM | Admit: 2017-01-23 | Discharge: 2017-01-23 | Disposition: A | Discharge status: Home / Self Care | Payer: Medicaid Other | Attending: Emergency Medicine | Admitting: Emergency Medicine

## 2017-01-23 DIAGNOSIS — Y999 Unspecified external cause status: Secondary | ICD-10-CM | POA: Insufficient documentation

## 2017-01-23 DIAGNOSIS — Z5321 Procedure and treatment not carried out due to patient leaving prior to being seen by health care provider: Secondary | ICD-10-CM | POA: Insufficient documentation

## 2017-01-23 DIAGNOSIS — S8012XA Contusion of left lower leg, initial encounter: Secondary | ICD-10-CM | POA: Diagnosis present

## 2017-01-23 DIAGNOSIS — W172XXA Fall into hole, initial encounter: Secondary | ICD-10-CM | POA: Insufficient documentation

## 2017-01-23 DIAGNOSIS — T07XXXA Unspecified multiple injuries, initial encounter: Secondary | ICD-10-CM

## 2017-01-23 DIAGNOSIS — Y939 Activity, unspecified: Secondary | ICD-10-CM | POA: Insufficient documentation

## 2017-01-23 DIAGNOSIS — M25532 Pain in left wrist: Secondary | ICD-10-CM | POA: Diagnosis not present

## 2017-01-23 DIAGNOSIS — Y929 Unspecified place or not applicable: Secondary | ICD-10-CM | POA: Insufficient documentation

## 2017-01-23 MED ORDER — OXYCODONE HCL 5 MG PO TABS
5.0000 mg | ORAL_TABLET | Freq: Once | ORAL | Status: AC
  Administered 2017-01-23: 5 mg via ORAL
  Filled 2017-01-23: qty 1

## 2017-01-23 MED ORDER — OXYCODONE HCL 5 MG PO TABS: 5.0000 mg | ORAL_TABLET | Freq: Four times a day (QID) | ORAL | 0 refills | Status: AC | PRN

## 2017-01-23 NOTE — ED Triage Notes (Signed)
Pt fell and tripped into a manhole on Thursday.  Pain and bruising to left shin and pain to left wrist. Ambulatory in triage.

## 2017-01-23 NOTE — ED Provider Notes (Signed)
[facility]-EMERGENCY DEPT Provider Note   CSN: [id number] Arrival date & time: 01/23/17  1156   By signing my name below, I, Clarisse Gouge, attest that this documentation has been prepared under the direction and in the presence of Raeford Razor, MD. Electronically signed, Clarisse Gouge, ED Scribe. 01/23/17. 2:05 PM.   History   Chief Complaint Chief Complaint  Patient presents with  . Fall   The history is provided by the patient and medical records. No language interpreter was used.    Tyler Guerrero is a 41 y.o. male presenting to the Emergency Department concerning L shin pain following a fall that occurred 3 days ago. Some swelling and bruising to the affected area, L wrist pain and swelling also noted. Pt states he fell down a manhole that had been left open and covered with cloth fabric, striking his shin on the edge of the opening and catching himself with his L hand. Pt describes 8/10, constant aches and soreness that are worse with ambulation and palpation. He reports prescribed Naproxen provides no relief at home. No numbness or tingling. No other complaints at this time.   Past Medical History:  Diagnosis Date  . Complication of anesthesia    Pseudocholinesterase deficiency diagnosed 2013  . Cough    smoker's cough  . Depression    not recently  . Kidney stone   . Labral tear of shoulder right shoulder  . Pseudocholinesterase deficiency   . Tingling occasional right arm    Patient Active Problem List   Diagnosis Date Noted  . Right foot pain 05/06/2015  . Chronic right shoulder pain 07/24/2013    Past Surgical History:  Procedure Laterality Date  . 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 JOINT REPLACEMENT    . KNEE ARTHROSCOPY     RIGHT KNEE 2012   &  LEFT KNE  2009  . KNEE ARTHROSCOPY Left 05/11/2013   Procedure: LEFT KNEE ARTHROSCOPY WITH MEDIAL MENISECTOMY;  Surgeon: Verlee Rossetti, MD;  Location: [facility] OR;  Service: Orthopedics;  Laterality: Left;  . LAPAROSCOPIC  CHOLECYSTECTOMY  01-29-2002  . SHOULDER ARTHROSCOPY  1992   RIGHT  . SHOULDER ARTHROSCOPY  11/03/2011   Procedure: ARTHROSCOPY SHOULDER;  Surgeon: Javier Docker, MD;  Location: [facility];  Service: Orthopedics;  Laterality: Right;  with labrial debridement       Home Medications    Prior to Admission medications   Medication Sig Start Date End Date Taking? Authorizing Provider  ALPRAZolam (XANAX) 1 MG tablet TAKE 1/2 -1 TAB BY MOUTH 2 TIMES A DAY AS NEEDED DAILY 02/11/15   [provider]  naproxen (NAPROSYN) 500 MG tablet Take 1 tablet (500 mg total) by mouth 2 (two) times daily. 10/24/16   Bethel Born, PA-C  oxyCODONE (ROXICODONE) 5 MG immediate release tablet Take 1 tablet (5 mg total) by mouth every 6 (six) hours as needed for severe pain. 04/29/15   Lenda Kelp, MD    Family History Family History  Problem Relation Age of Onset  . Hypertension Father   . Heart attack Father   . Hypertension Brother     Social History Social History  Substance Use Topics  . Smoking status: Current Every Day Smoker    Packs/day: 1.50    Years: 15.00    Types: Cigarettes  . Smokeless tobacco: Never Used  . Alcohol use No     Allergies   Succinylcholine; Tramadol; Ibuprofen; Toradol [ketorolac tromethamine]; and Tylenol [acetaminophen]  Review of Systems Review of Systems  Constitutional: Negative for fever.  HENT: Negative for facial swelling.   Gastrointestinal: Negative for nausea and vomiting.  Musculoskeletal: Positive for arthralgias, joint swelling and myalgias. Negative for gait problem, neck pain and neck stiffness.  Skin: Positive for color change. Negative for wound.  Neurological: Negative for weakness and numbness.     Physical Exam Updated Vital Signs BP 128/79 (BP Location: Right Arm)   Pulse 67   Temp 98.5 F (36.9 C) (Oral)   Resp 18   Wt 300 lb (136.1 kg)   SpO2 99%   BMI 40.69 kg/m   Physical Exam    Constitutional: He is oriented to person, place, and time. He appears well-developed and well-nourished.  HENT:  Head: Normocephalic.  Eyes: EOM are normal.  Neck: Normal range of motion.  Pulmonary/Chest: Effort normal.  Abdominal: He exhibits no distension.  Musculoskeletal: Normal range of motion. He exhibits tenderness. He exhibits no deformity.  Ecchymosis and mild swelling to L mid shin, NVI distally. L wrist NL in appearance, can range actively against resistance, NVI distally.  Neurological: He is alert and oriented to person, place, and time.  Psychiatric: He has a normal mood and affect.  Nursing note and vitals reviewed.    ED Treatments / Results  DIAGNOSTIC STUDIES: Oxygen Saturation is 99% on RA, NL by my interpretation.    COORDINATION OF CARE: 1:51 PM-Discussed next steps with pt. Pt verbalized understanding and is agreeable with the plan. Will Rx medications.   Labs (all labs ordered are listed, but only abnormal results are displayed) Labs Reviewed - No data to display  EKG  EKG Interpretation None       Radiology Dg Wrist Complete Left  Result Date: 01/23/2017 CLINICAL DATA:  Patient falling into an unfinished manhole, has bruising and pain to the anterior mid shaft of left tib/fib, patient reports minor swelling and pain to the medial side of his left wrist, patient reports he tried to catch his self with his left hand, reports his left leg is more severe than his wrist. EXAM: LEFT WRIST - COMPLETE 3+ VIEW COMPARISON:  None. FINDINGS: There is no evidence of fracture or dislocation. There is no evidence of arthropathy or other focal bone abnormality. Soft tissues are unremarkable. IMPRESSION: Negative. Electronically Signed   By: Amie Portland M.D.   On: 01/23/2017 13:31   Dg Tibia/fibula Left  Result Date: 01/23/2017 CLINICAL DATA:  Patient falling into an unfinished manhole, has bruising and pain to the anterior mid shaft of left tib/fib, patient  reports minor swelling and pain to the medial side of his left wrist, patient reports he tried to catch his self with his left hand, reports his left leg is more severe than his wrist. EXAM: LEFT TIBIA AND FIBULA - 2 VIEW COMPARISON:  None. FINDINGS: No fracture bone lesion. Knee and ankle joints are normally spaced and aligned. Mild subcutaneous soft tissue edema most evident anteriorly. IMPRESSION: No fracture or dislocation. Electronically Signed   By: Amie Portland M.D.   On: 01/23/2017 13:30    Procedures Procedures (including critical care time)  Medications Ordered in ED Medications - No data to display   Initial Impression / Assessment and Plan / ED Course  I have reviewed the triage vital signs and the nursing notes.  Pertinent labs & imaging results that were available during my care of the patient were reviewed by me and considered in my medical decision making (see chart for  details).     Negative imaging. Neurovascularly intact. ST and pain medication. Return precautions discussed.  Final Clinical Impressions(s) / ED Diagnoses   Final diagnoses:  Multiple contusions    New Prescriptions New Prescriptions   No medications on file  I personally preformed the services scribed in my presence. The recorded information has been reviewed is accurate. Raeford Razor, MD.    Raeford Razor, MD 02/09/17 [phone]

## 2017-01-23 NOTE — Discharge Instructions (Signed)
Your x-rays today do not show any damage to your bones. There is some soft tissue swelling noted around the area you have the bruising on your skin. Keep taking naproxen as needed. Take the oxycodone as needed if naproxen is not sufficient. Your activity if limited by your pain (ie, if it hurts too much then don't do it). I expect your pain to get progressively better over the next several days to about a week.

## 2017-08-03 ENCOUNTER — Other Ambulatory Visit: Payer: Self-pay

## 2017-08-03 ENCOUNTER — Emergency Department (HOSPITAL_COMMUNITY)
Admission: EM | Admit: 2017-08-03 | Discharge: 2017-08-03 | Disposition: A | Discharge status: Home / Self Care | Payer: Medicaid Other | Attending: Emergency Medicine | Admitting: Emergency Medicine

## 2017-08-03 ENCOUNTER — Encounter (HOSPITAL_COMMUNITY): Payer: Self-pay

## 2017-08-03 DIAGNOSIS — F17210 Nicotine dependence, cigarettes, uncomplicated: Secondary | ICD-10-CM | POA: Insufficient documentation

## 2017-08-03 DIAGNOSIS — Z79899 Other long term (current) drug therapy: Secondary | ICD-10-CM | POA: Insufficient documentation

## 2017-08-03 DIAGNOSIS — K0381 Cracked tooth: Secondary | ICD-10-CM | POA: Diagnosis not present

## 2017-08-03 DIAGNOSIS — R030 Elevated blood-pressure reading, without diagnosis of hypertension: Secondary | ICD-10-CM | POA: Diagnosis not present

## 2017-08-03 DIAGNOSIS — K0889 Other specified disorders of teeth and supporting structures: Secondary | ICD-10-CM | POA: Diagnosis present

## 2017-08-03 DIAGNOSIS — Z9660 Presence of unspecified orthopedic joint implant: Secondary | ICD-10-CM | POA: Insufficient documentation

## 2017-08-03 MED ORDER — AMOXICILLIN 500 MG PO CAPS: 500.0000 mg | ORAL_CAPSULE | Freq: Three times a day (TID) | ORAL | 0 refills | Status: AC

## 2017-08-03 NOTE — ED Provider Notes (Signed)
[facility]-EMERGENCY DEPT Provider Note   CSN: [id number] Arrival date & time: 08/03/17  0849     History   Chief Complaint Chief Complaint  Patient presents with  . Dental Pain    HPI YONA STANSBURY is a 42 y.o. male with a hx of tobacco abuse and depression who presents to the emergency department with complaints of dental pain that started this morning after cracking a tooth while eating. Cracked tooth is the most posterior R upper molar. States pain is constant and a 6/10 in severity, tried Aspirin with minimal improve, otherwise without alleviating/aggravating factors. Patient states he does not see a dentist.  Denies fever, chills, or drainage from the area.   HPI  Past Medical History:  Diagnosis Date  . Complication of anesthesia    Pseudocholinesterase deficiency diagnosed 2013  . Cough    smoker's cough  . Depression    not recently  . Kidney stone   . Labral tear of shoulder right shoulder  . Pseudocholinesterase deficiency   . Tingling occasional right arm    Patient Active Problem List   Diagnosis Date Noted  . Right foot pain 05/06/2015  . Chronic right shoulder pain 07/24/2013    Past Surgical History:  Procedure Laterality Date  . 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 JOINT REPLACEMENT    . KNEE ARTHROSCOPY     RIGHT KNEE 2012   &  LEFT KNE  2009  . KNEE ARTHROSCOPY Left 05/11/2013   Procedure: LEFT KNEE ARTHROSCOPY WITH MEDIAL MENISECTOMY;  Surgeon: Verlee Rossetti, MD;  Location: [facility] OR;  Service: Orthopedics;  Laterality: Left;  . LAPAROSCOPIC CHOLECYSTECTOMY  01-29-2002  . SHOULDER ARTHROSCOPY  1992   RIGHT  . SHOULDER ARTHROSCOPY  11/03/2011   Procedure: ARTHROSCOPY SHOULDER;  Surgeon: Javier Docker, MD;  Location: [facility];  Service: Orthopedics;  Laterality: Right;  with labrial debridement       Home Medications    Prior to Admission medications   Medication Sig Start Date End Date Taking? Authorizing  Provider  ALPRAZolam (XANAX) 1 MG tablet TAKE 1/2 -1 TAB BY MOUTH 2 TIMES A DAY AS NEEDED DAILY 02/11/15   [provider]  naproxen (NAPROSYN) 500 MG tablet Take 1 tablet (500 mg total) by mouth 2 (two) times daily. 10/24/16   Bethel Born, PA-C  oxyCODONE (ROXICODONE) 5 MG immediate release tablet Take 1 tablet (5 mg total) by mouth every 6 (six) hours as needed for severe pain. 04/29/15   Hudnall, Azucena Fallen, MD  oxyCODONE (ROXICODONE) 5 MG immediate release tablet Take 1 tablet (5 mg total) by mouth every 6 (six) hours as needed for severe pain. 01/23/17   Raeford Razor, MD    Family History Family History  Problem Relation Age of Onset  . Hypertension Father   . Heart attack Father   . Hypertension Brother     Social History Social History   Tobacco Use  . Smoking status: Current Every Day Smoker    Packs/day: 1.50    Years: 15.00    Pack years: 22.50    Types: Cigarettes  . Smokeless tobacco: Never Used  Substance Use Topics  . Alcohol use: No    Alcohol/week: 0.0 oz  . Drug use: No     Allergies   Succinylcholine; Tramadol; Ibuprofen; Toradol [ketorolac tromethamine]; and Tylenol [acetaminophen]   Review of Systems Review of Systems  Constitutional: Negative for chills and fever.  HENT: Positive for  dental problem. Negative for drooling, ear pain and voice change.   Respiratory: Negative for shortness of breath.      Physical Exam Updated Vital Signs BP (!) 157/70 (BP Location: Right Arm)   Pulse 72   Temp 98.3 F (36.8 C) (Oral)   Resp 16   SpO2 99%   Physical Exam  Constitutional: He appears well-developed and well-nourished.  Non-toxic appearance. No distress.  HENT:  Head: Normocephalic and atraumatic.  Right Ear: Tympanic membrane is not perforated, not erythematous, not retracted and not bulging.  Left Ear: Tympanic membrane is not perforated, not erythematous, not retracted and not bulging.  Nose: Nose normal.  Mouth/Throat: Uvula is  midline and oropharynx is clear and moist. No oropharyngeal exudate or posterior oropharyngeal erythema.    No trismus. No drooling. Patient is tolerating his own secretions without difficulty. No hot potato voice. Submandibular compartment is soft.   Eyes: Conjunctivae are normal. Right eye exhibits no discharge. Left eye exhibits no discharge.  Neck: Normal range of motion. Neck supple.  Lymphadenopathy:    He has no cervical adenopathy.  Neurological: He is alert.  Psychiatric: He has a normal mood and affect. His behavior is normal. Thought content normal.  Nursing note and vitals reviewed.    ED Treatments / Results  Labs (all labs ordered are listed, but only abnormal results are displayed) Labs Reviewed - No data to display  EKG  EKG Interpretation None      Radiology No results found.  Procedures Procedures (including critical care time)  Medications Ordered in ED Medications - No data to display   Initial Impression / Assessment and Plan / ED Course  I have reviewed the triage vital signs and the nursing notes.  Pertinent labs & imaging results that were available during my care of the patient were reviewed by me and considered in my medical decision making (see chart for details).    Patient presents with dental pain due to cracked tooth. Patient is nontoxic appearing, in no apparent distress, vitals WNL other than elevated BP-no indication of HTN emergency, patient aware of need for recheck.  No gross abscess.  Exam unconcerning for Ludwig's angina or spread of infection.  Do not think that tooth/gingiva are acutely infected- will provide Amoxicillin for prevention during interim while trying to see a dentist.  Urged patient to follow-up with dentist within 48 hours, dental resources were provided. Patient with extensive allergy list to analgesics, recommended continued aspirin PRN with key being dental follow up.  Discussed treatment plan and need for follow up as  well as return precautions. Provided opportunity for questions, patient confirmed understanding and is agreeable to plan.   Final Clinical Impressions(s) / ED Diagnoses   Final diagnoses:  Pain, dental    ED Discharge Orders    None       Cherly Andersonetrucelli, Samantha R, PA-C 08/03/17 1042    Derwood KaplanNanavati, Ankit, MD 08/03/17 1635

## 2017-08-03 NOTE — Discharge Instructions (Addendum)
Call the dentist in your discharge instructions, Dr. Mayford Knifeurner, or one of the dentists offices provided in the dental resource guide to schedule an appointment for re-evaluation and further management within the next 48 hours.   I have prescribed you amoxicillin which is an antibiotic to treat potential infection development. Continue to take Aspirin for pain as needed.   Please take all of your antibiotics until finished. You may develop abdominal discomfort or diarrhea from the antibiotic.  You may help offset this with probiotics which you can buy at the store (ask your pharmacist if unable to find) or get probiotics in the form of eating yogurt. Do not eat or take the probiotics until 2 hours after your antibiotic. If you are unable to tolerate these side effects follow-up with your primary care provider or return to the emergency department.   If you begin to experience any blistering, rashes, swelling, or difficulty breathing seek medical care for evaluation of potentially more serious side effects.   Please be aware that this medication may interact with other medications you are taking, please be sure to discuss your medication list with your pharmacist.   If you start to experience and new or worsening symptoms return to the emergency department. If you start to experience fever, chills, neck stiffness/pain, or inability to move your neck or open your mouth come back to the emergency department immediately.    Additionally your blood pressure was elevated in the emergency department today. Please follow up with your primary care provider for a recheck of this in 1 week.   Vitals:   08/03/17 0903  BP: (!) 157/70  Pulse: 72  Resp: 16  Temp: 98.3 F (36.8 C)  SpO2: 99%

## 2017-08-03 NOTE — ED Triage Notes (Signed)
He states "My (right upper) back tooth broke off today" [sic]. He is in no distress.

## 2017-09-28 ENCOUNTER — Emergency Department (HOSPITAL_COMMUNITY)
Admission: EM | Admit: 2017-09-28 | Discharge: 2017-09-28 | Disposition: A | Discharge status: Home / Self Care | Payer: Medicaid Other | Attending: Emergency Medicine | Admitting: Emergency Medicine

## 2017-09-28 ENCOUNTER — Encounter (HOSPITAL_COMMUNITY): Payer: Self-pay

## 2017-09-28 ENCOUNTER — Other Ambulatory Visit: Payer: Self-pay

## 2017-09-28 DIAGNOSIS — Z79899 Other long term (current) drug therapy: Secondary | ICD-10-CM | POA: Insufficient documentation

## 2017-09-28 DIAGNOSIS — Z96653 Presence of artificial knee joint, bilateral: Secondary | ICD-10-CM | POA: Insufficient documentation

## 2017-09-28 DIAGNOSIS — M25561 Pain in right knee: Secondary | ICD-10-CM | POA: Insufficient documentation

## 2017-09-28 DIAGNOSIS — M67431 Ganglion, right wrist: Secondary | ICD-10-CM | POA: Insufficient documentation

## 2017-09-28 DIAGNOSIS — F17210 Nicotine dependence, cigarettes, uncomplicated: Secondary | ICD-10-CM | POA: Insufficient documentation

## 2017-09-28 MED ORDER — DICLOFENAC SODIUM 1 % TD GEL: 2.0000 g | Freq: Four times a day (QID) | TRANSDERMAL | 0 refills | Status: AC

## 2017-09-28 MED ORDER — NAPROXEN 500 MG PO TABS: 500.0000 mg | ORAL_TABLET | Freq: Two times a day (BID) | ORAL | 0 refills | Status: AC

## 2017-09-28 NOTE — ED Notes (Signed)
Pt provided d/c instructions & discussed; pt verbalized understanding.

## 2017-09-28 NOTE — ED Provider Notes (Signed)
[facility]-EMERGENCY DEPT Provider Note   CSN: [id number] Arrival date & time: 09/28/17  1648     History   Chief Complaint Chief Complaint  Patient presents with  . Wrist Pain  . Knee Pain    HPI Tyler Guerrero is a 42 y.o. male presenting for evaluation of 3-week history of right wrist and knee pain.  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  He has been trying to manage it with naproxen and rest, but this is not been improving his pain.  Pain is mostly medial, feels similar to when he has torn his meniscus in the past.  He has had multiple knee surgeries on both knees, always by Drs with [facility].  He has not followed up with orthopedics yet, stating that he needs a referral before he can go.  He denies numbness or tingling.  He states pain is constant, worse with standing and movement.  Nothing makes it completely go away.  No radiation to his hip or ankle. Additionally, he is reporting 3-week history of right wrist pain.  He has a lesion on the dorsal aspect of his right wrist that occasionally swells and then reduces.  When it is very swollen, he reports increased pain.  There is pain with movement of his wrist and making a fist.  He denies numbness or tingling.  He states it feels similar to a ganglion cyst he has had on the other side, but more severe.  He denies trauma or injury.  He denies radiation of the pain.  Naproxen does not make it better.  HPI  Past Medical History:  Diagnosis Date  . Complication of anesthesia    Pseudocholinesterase deficiency diagnosed 2013  . Cough    smoker's cough  . Depression    not recently  . Kidney stone   . Labral tear of shoulder right shoulder  . Pseudocholinesterase deficiency   . Tingling occasional right arm    Patient Active Problem List   Diagnosis Date Noted  . Right foot pain 05/06/2015  . Chronic right shoulder pain 07/24/2013     Past Surgical History:  Procedure Laterality Date  . 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 JOINT REPLACEMENT    . KNEE ARTHROSCOPY     RIGHT KNEE 2012   &  LEFT KNE  2009  . KNEE ARTHROSCOPY Left 05/11/2013   Procedure: LEFT KNEE ARTHROSCOPY WITH MEDIAL MENISECTOMY;  Surgeon: Verlee Rossetti, MD;  Location: [facility] OR;  Service: Orthopedics;  Laterality: Left;  . LAPAROSCOPIC CHOLECYSTECTOMY  01-29-2002  . SHOULDER ARTHROSCOPY  1992   RIGHT  . SHOULDER ARTHROSCOPY  11/03/2011   Procedure: ARTHROSCOPY SHOULDER;  Surgeon: Javier Docker, MD;  Location: [facility];  Service: Orthopedics;  Laterality: Right;  with labrial debridement        Home Medications    Prior to Admission medications   Medication Sig Start Date End Date Taking? Authorizing Provider  ALPRAZolam (XANAX) 1 MG tablet TAKE 1/2 -1 TAB BY MOUTH 2 TIMES A DAY AS NEEDED DAILY 02/11/15   [provider]  amoxicillin (AMOXIL) 500 MG capsule Take 1 capsule (500 mg total) by mouth 3 (three) times daily. 08/03/17   Petrucelli, Samantha R, PA-C  diclofenac sodium (VOLTAREN) 1 % GEL Apply 2 g topically 4 (four) times daily. 09/28/17   Jajuan Skoog, PA-C  naproxen (NAPROSYN) 500 MG tablet Take 1  tablet (500 mg total) by mouth 2 (two) times daily. 09/28/17   Kazuto Sevey, PA-C  oxyCODONE (ROXICODONE) 5 MG immediate release tablet Take 1 tablet (5 mg total) by mouth every 6 (six) hours as needed for severe pain. 04/29/15   Hudnall, Azucena Fallen, MD  oxyCODONE (ROXICODONE) 5 MG immediate release tablet Take 1 tablet (5 mg total) by mouth every 6 (six) hours as needed for severe pain. 01/23/17   Raeford Razor, MD    Family History Family History  Problem Relation Age of Onset  . Hypertension Father   . Heart attack Father   . Hypertension Brother     Social History Social History   Tobacco Use  . Smoking status: Current Every Day Smoker    Packs/day: 1.50    Years: 15.00    Pack years: 22.50    Types:  Cigarettes  . Smokeless tobacco: Never Used  Substance Use Topics  . Alcohol use: No    Alcohol/week: 0.0 oz  . Drug use: No     Allergies   Succinylcholine; Tramadol; Ibuprofen; Toradol [ketorolac tromethamine]; and Tylenol [acetaminophen]   Review of Systems Review of Systems  Musculoskeletal: Positive for arthralgias.  Neurological: Negative for numbness.  Hematological: Does not bruise/bleed easily.    Physical Exam Updated Vital Signs BP (!) 142/93 (BP Location: Right Arm)   Pulse 85   Temp 98.8 F (37.1 C) (Oral)   Resp 16   Ht 6' (1.829 m)   Wt (!) 158.8 kg (350 lb)   SpO2 96%   BMI 47.47 kg/m   Physical Exam  Constitutional: He is oriented to person, place, and time. He appears well-developed and well-nourished. No distress.  HENT:  Head: Normocephalic and atraumatic.  Eyes: EOM are normal.  Neck: Normal range of motion.  Pulmonary/Chest: Effort normal.  Abdominal: He exhibits no distension.  Musculoskeletal: He exhibits tenderness.  Nontender soft lesion of the dorsal wrist consistent with ganglion cyst.  Radial pulses intact bilaterally.  Sensation intact bilaterally.  Strength of the fingers against resistance intact.  Full active range of motion of wrist and fingers.  No redness, warmth, or streaking. No obvious deformity or swelling of the right knee.  Tender to palpation of bilateral joint space.  Popliteal and pedal pulses intact bilaterally.  Sensation intact bilaterally.  Patient is Press photographer.  No pain with varus or valgus stress.  Neurological: He is alert and oriented to person, place, and time. No sensory deficit.  Skin: Skin is warm. No rash noted.  Psychiatric: He has a normal mood and affect.  Nursing note and vitals reviewed.    ED Treatments / Results  Labs (all labs ordered are listed, but only abnormal results are displayed) Labs Reviewed - No data to display  EKG None  Radiology No results found.  Procedures Procedures  (including critical care time)  Medications Ordered in ED Medications - No data to display   Initial Impression / Assessment and Plan / ED Course  I have reviewed the triage vital signs and the nursing notes.  Pertinent labs & imaging results that were available during my care of the patient were reviewed by me and considered in my medical decision making (see chart for details).     Patient presenting for evaluation of right wrist and right knee pain.  Physical exam reassuring, he is neurovascularly intact.  No signs of infection.  Likely ganglion cyst.  ? if knee pain is related to arthritis versus cartilage/ligament injury.  Will have  patient follow-up with orthopedics.  Also given information for hand if orthopedics does not manage ganglion cyst.  Will treat symptomatically with NSAIDs, wrist splint, knee sleeve, and Voltaren gel.  At this time, patient appears safe for discharge.  Return precautions given.  Patient states he understands and agrees to plan.  Final Clinical Impressions(s) / ED Diagnoses   Final diagnoses:  Ganglion cyst of dorsum of right wrist  Acute pain of right knee    ED Discharge Orders        Ordered    naproxen (NAPROSYN) 500 MG tablet  2 times daily     09/28/17 1921    diclofenac sodium (VOLTAREN) 1 % GEL  4 times daily     09/28/17 1921       Alveria ApleyCaccavale, Shelvia Fojtik, PA-C 09/28/17 2020    Nira Connardama, Pedro Eduardo, MD 09/28/17 2352

## 2017-09-28 NOTE — Discharge Instructions (Signed)
Take naproxen 2 times a day with meals.  Do not take other anti-inflammatories at the same time open (Advil, Motrin, naproxen, Aleve).  Use voltaren gel as needed for pain.  Use ice packs to help control your pain. Use the wrist splint and knee sleeve for swelling and support.  Follow up with the orthopedic doctor for further evaluation. If you need, I also included information for the hand doctor.  Return to the ER if you develop numbness, color change of your foot/hand, or any new or concerning symptoms,

## 2017-09-28 NOTE — ED Triage Notes (Signed)
Patient c/o right wrist and right knee pain x 3 weeks. Patient has slight swelling of the right wrist. patient states pain gradually worsening.

## 2019-07-08 ENCOUNTER — Other Ambulatory Visit: Payer: Self-pay

## 2019-07-08 ENCOUNTER — Emergency Department (HOSPITAL_BASED_OUTPATIENT_CLINIC_OR_DEPARTMENT_OTHER)
Admission: EM | Admit: 2019-07-08 | Discharge: 2019-07-08 | Disposition: A | Discharge status: Home / Self Care | Payer: Medicaid Other | Attending: Emergency Medicine | Admitting: Emergency Medicine

## 2019-07-08 ENCOUNTER — Encounter (HOSPITAL_BASED_OUTPATIENT_CLINIC_OR_DEPARTMENT_OTHER): Payer: Self-pay | Admitting: Emergency Medicine

## 2019-07-08 DIAGNOSIS — F17210 Nicotine dependence, cigarettes, uncomplicated: Secondary | ICD-10-CM | POA: Insufficient documentation

## 2019-07-08 DIAGNOSIS — Z886 Allergy status to analgesic agent status: Secondary | ICD-10-CM | POA: Insufficient documentation

## 2019-07-08 DIAGNOSIS — L03213 Periorbital cellulitis: Secondary | ICD-10-CM

## 2019-07-08 DIAGNOSIS — Z885 Allergy status to narcotic agent status: Secondary | ICD-10-CM | POA: Insufficient documentation

## 2019-07-08 DIAGNOSIS — Z888 Allergy status to other drugs, medicaments and biological substances status: Secondary | ICD-10-CM | POA: Insufficient documentation

## 2019-07-08 DIAGNOSIS — Z79899 Other long term (current) drug therapy: Secondary | ICD-10-CM | POA: Insufficient documentation

## 2019-07-08 MED ORDER — CLINDAMYCIN HCL 300 MG PO CAPS: 300.0000 mg | ORAL_CAPSULE | Freq: Three times a day (TID) | ORAL | 0 refills | Status: AC

## 2019-07-08 NOTE — ED Triage Notes (Signed)
He woke up with R eye pain and swelling.

## 2019-07-08 NOTE — ED Notes (Signed)
Pt verbalized understanding of dc instructions.

## 2019-07-08 NOTE — ED Provider Notes (Signed)
[facility] EMERGENCY DEPARTMENT Provider Note   CSN: [id number] Arrival date & time: 07/08/19  2563     History Chief Complaint  Patient presents with  . Eye Problem    Tyler Guerrero is a 44 y.o. male.  The history is provided by the patient.  Eye Problem Location:  Right eye Quality:  Aching Severity:  Mild Onset quality:  Sudden Duration: present when he woke up this morning. Timing:  Constant Progression:  Unchanged Chronicity:  New Context comment:  Eye was normal last night before bed and when woke up this morning right lower lid is swollen and painful Relieved by:  None tried Exacerbated by: palpation. Ineffective treatments:  None tried Associated symptoms: itching, redness and swelling   Associated symptoms: no blurred vision, no crusting, no decreased vision, no discharge, no double vision, no facial rash, no headaches, no inflammation and no photophobia        Past Medical History:  Diagnosis Date  . Complication of anesthesia    Pseudocholinesterase deficiency diagnosed 2013  . Cough    smoker's cough  . Depression    not recently  . Kidney stone   . Labral tear of shoulder right shoulder  . Pseudocholinesterase deficiency   . Tingling occasional right arm    Patient Active Problem List   Diagnosis Date Noted  . Right foot pain 05/06/2015  . Chronic right shoulder pain 07/24/2013    Past Surgical History:  Procedure Laterality Date  . 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 JOINT REPLACEMENT    . KNEE ARTHROSCOPY     RIGHT KNEE 2012   &  LEFT KNE  2009  . KNEE ARTHROSCOPY Left 05/11/2013   Procedure: LEFT KNEE ARTHROSCOPY WITH MEDIAL MENISECTOMY;  Surgeon: Verlee Rossetti, MD;  Location: [facility] OR;  Service: Orthopedics;  Laterality: Left;  . LAPAROSCOPIC CHOLECYSTECTOMY  01-29-2002  . SHOULDER ARTHROSCOPY  1992   RIGHT  . SHOULDER ARTHROSCOPY  11/03/2011   Procedure: ARTHROSCOPY SHOULDER;  Surgeon: Javier Docker, MD;  Location: [facility];  Service: Orthopedics;  Laterality: Right;  with labrial debridement       Family History  Problem Relation Age of Onset  . Hypertension Father   . Heart attack Father   . Hypertension Brother     Social History   Tobacco Use  . Smoking status: Current Every Day Smoker    Packs/day: 1.50    Years: 15.00    Pack years: 22.50    Types: Cigarettes  . Smokeless tobacco: Never Used  Substance Use Topics  . Alcohol use: No    Alcohol/week: 0.0 standard drinks  . Drug use: No    Home Medications Prior to Admission medications   Medication Sig Start Date End Date Taking? Authorizing Provider  ALPRAZolam (XANAX) 1 MG tablet TAKE 1/2 -1 TAB BY MOUTH 2 TIMES A DAY AS NEEDED DAILY 02/11/15   [provider]  amoxicillin (AMOXIL) 500 MG capsule Take 1 capsule (500 mg total) by mouth 3 (three) times daily. 08/03/17   Petrucelli, Samantha R, PA-C  clindamycin (CLEOCIN) 300 MG capsule Take 1 capsule (300 mg total) by mouth every 8 (eight) hours. 07/08/19   Gwyneth Sprout, MD  diclofenac sodium (VOLTAREN) 1 % GEL Apply 2 g topically 4 (four) times daily. 09/28/17   Caccavale, Sophia, PA-C  naproxen (NAPROSYN) 500 MG tablet Take 1 tablet (500 mg total) by mouth 2 (two) times daily. 09/28/17   Caccavale, Sophia,  PA-C  oxyCODONE (ROXICODONE) 5 MG immediate release tablet Take 1 tablet (5 mg total) by mouth every 6 (six) hours as needed for severe pain. 04/29/15   Hudnall, Sharyn Lull, MD  oxyCODONE (ROXICODONE) 5 MG immediate release tablet Take 1 tablet (5 mg total) by mouth every 6 (six) hours as needed for severe pain. 01/23/17   Virgel Manifold, MD    Allergies    Succinylcholine, Tramadol, Ibuprofen, Toradol [ketorolac tromethamine], and Tylenol [acetaminophen]  Review of Systems   Review of Systems  Eyes: Positive for redness and itching. Negative for blurred vision, double vision, photophobia and discharge.  Neurological: Negative for headaches.  All other systems  reviewed and are negative.   Physical Exam Updated Vital Signs BP (!) 163/94 (BP Location: Right Arm)   Pulse 100   Temp 98.7 F (37.1 C) (Oral)   Resp (!) 22   Ht 6' (1.829 m)   Wt (!) 172.4 kg   SpO2 97%   BMI 51.54 kg/m   Physical Exam Vitals and nursing note reviewed.  Constitutional:      General: He is not in acute distress.    Appearance: Normal appearance. He is obese.  HENT:     Head: Normocephalic.     Nose: Nose normal.     Mouth/Throat:     Mouth: Mucous membranes are moist.  Eyes:     General: Vision grossly intact.     Extraocular Movements: Extraocular movements intact.     Conjunctiva/sclera: Conjunctivae normal.     Pupils: Pupils are equal, round, and reactive to light.      Comments: No right conjunctival injection or drainage or crusting.  Cardiovascular:     Rate and Rhythm: Normal rate.  Pulmonary:     Effort: Pulmonary effort is normal.  Skin:    General: Skin is warm and dry.  Neurological:     Mental Status: He is alert.  Psychiatric:        Mood and Affect: Mood normal.        Behavior: Behavior normal.        Thought Content: Thought content normal.     ED Results / Procedures / Treatments   Labs (all labs ordered are listed, but only abnormal results are displayed) Labs Reviewed - No data to display  EKG None  Radiology No results found.  Procedures Procedures (including critical care time)  Medications Ordered in ED Medications - No data to display  ED Course  I have reviewed the triage vital signs and the nursing notes.  Pertinent labs & imaging results that were available during my care of the patient were reviewed by me and considered in my medical decision making (see chart for details).    MDM Rules/Calculators/A&P                      Patient presenting today with sudden onset of right lower lid swelling, mild itching and discomfort.  Patient has edema of the right lower lid towards the medial canthus.  It  does not appear to involve the lacrimal duct and eye and conjunctive a seem to be normal.  Mild tenderness and induration with palpation.  Potential chalazion versus preseptal cellulitis.  Patient covered with clindamycin and given ophthalmology follow-up in the next 2 to 3 days for recheck of his not improving.  He was given strict return precautions.  He has no pain with extraocular movements or concern for orbital cellulitis at this time.  Final Clinical Impression(s) / ED Diagnoses Final diagnoses:  Preseptal cellulitis of right lower eyelid    Rx / DC Orders ED Discharge Orders         Ordered    clindamycin (CLEOCIN) 300 MG capsule  Every 8 hours     07/08/19 0829           Gwyneth Sprout, MD 07/08/19 [phone]

## 2019-07-08 NOTE — Discharge Instructions (Addendum)
If redness and swelling get worse or your eye starts hurting and turns red or vision changes see the eye doctor immediately or return to the ER. Also apply warm compresses to the lower eyelid 2-3 times per day

## 2020-02-20 ENCOUNTER — Other Ambulatory Visit: Payer: Self-pay

## 2020-02-20 DIAGNOSIS — Z20822 Contact with and (suspected) exposure to covid-19: Secondary | ICD-10-CM

## 2020-02-23 LAB — NOVEL CORONAVIRUS, NAA: SARS-CoV-2, NAA: NOT DETECTED

## 2020-02-27 ENCOUNTER — Other Ambulatory Visit: Payer: Medicaid Other

## 2020-03-28 ENCOUNTER — Inpatient Hospital Stay (HOSPITAL_COMMUNITY): Payer: Managed Care, Other (non HMO)

## 2020-03-28 ENCOUNTER — Inpatient Hospital Stay (HOSPITAL_COMMUNITY)
Admission: EM | Admit: 2020-03-28 | Discharge: 2020-04-07 | DRG: 957 | Disposition: E | Payer: Managed Care, Other (non HMO) | Attending: General Surgery | Admitting: General Surgery

## 2020-03-28 ENCOUNTER — Emergency Department (HOSPITAL_COMMUNITY): Payer: Managed Care, Other (non HMO)

## 2020-03-28 ENCOUNTER — Encounter (HOSPITAL_COMMUNITY): Payer: Self-pay | Admitting: Emergency Medicine

## 2020-03-28 DIAGNOSIS — U071 COVID-19: Secondary | ICD-10-CM | POA: Diagnosis present

## 2020-03-28 DIAGNOSIS — S270XXA Traumatic pneumothorax, initial encounter: Principal | ICD-10-CM | POA: Diagnosis present

## 2020-03-28 DIAGNOSIS — E8809 Other disorders of plasma-protein metabolism, not elsewhere classified: Secondary | ICD-10-CM | POA: Diagnosis present

## 2020-03-28 DIAGNOSIS — D62 Acute posthemorrhagic anemia: Secondary | ICD-10-CM | POA: Diagnosis not present

## 2020-03-28 DIAGNOSIS — S32049A Unspecified fracture of fourth lumbar vertebra, initial encounter for closed fracture: Secondary | ICD-10-CM | POA: Diagnosis present

## 2020-03-28 DIAGNOSIS — S32810A Multiple fractures of pelvis with stable disruption of pelvic ring, initial encounter for closed fracture: Secondary | ICD-10-CM | POA: Diagnosis present

## 2020-03-28 DIAGNOSIS — S329XXA Fracture of unspecified parts of lumbosacral spine and pelvis, initial encounter for closed fracture: Secondary | ICD-10-CM

## 2020-03-28 DIAGNOSIS — R739 Hyperglycemia, unspecified: Secondary | ICD-10-CM | POA: Diagnosis present

## 2020-03-28 DIAGNOSIS — E872 Acidosis: Secondary | ICD-10-CM | POA: Diagnosis not present

## 2020-03-28 DIAGNOSIS — S3282XA Multiple fractures of pelvis without disruption of pelvic ring, initial encounter for closed fracture: Secondary | ICD-10-CM | POA: Diagnosis present

## 2020-03-28 DIAGNOSIS — F17210 Nicotine dependence, cigarettes, uncomplicated: Secondary | ICD-10-CM | POA: Diagnosis present

## 2020-03-28 DIAGNOSIS — S27322D Contusion of lung, bilateral, subsequent encounter: Secondary | ICD-10-CM | POA: Diagnosis not present

## 2020-03-28 DIAGNOSIS — S32039A Unspecified fracture of third lumbar vertebra, initial encounter for closed fracture: Secondary | ICD-10-CM | POA: Diagnosis present

## 2020-03-28 DIAGNOSIS — R578 Other shock: Secondary | ICD-10-CM | POA: Diagnosis present

## 2020-03-28 DIAGNOSIS — S32059A Unspecified fracture of fifth lumbar vertebra, initial encounter for closed fracture: Secondary | ICD-10-CM | POA: Diagnosis present

## 2020-03-28 DIAGNOSIS — Y92410 Unspecified street and highway as the place of occurrence of the external cause: Secondary | ICD-10-CM

## 2020-03-28 DIAGNOSIS — S0101XA Laceration without foreign body of scalp, initial encounter: Secondary | ICD-10-CM | POA: Diagnosis present

## 2020-03-28 DIAGNOSIS — Z23 Encounter for immunization: Secondary | ICD-10-CM

## 2020-03-28 DIAGNOSIS — N132 Hydronephrosis with renal and ureteral calculous obstruction: Secondary | ICD-10-CM | POA: Diagnosis present

## 2020-03-28 DIAGNOSIS — Z9689 Presence of other specified functional implants: Secondary | ICD-10-CM

## 2020-03-28 DIAGNOSIS — J9601 Acute respiratory failure with hypoxia: Secondary | ICD-10-CM | POA: Diagnosis not present

## 2020-03-28 DIAGNOSIS — J939 Pneumothorax, unspecified: Secondary | ICD-10-CM

## 2020-03-28 DIAGNOSIS — S42309A Unspecified fracture of shaft of humerus, unspecified arm, initial encounter for closed fracture: Secondary | ICD-10-CM

## 2020-03-28 DIAGNOSIS — S27321A Contusion of lung, unilateral, initial encounter: Secondary | ICD-10-CM | POA: Diagnosis present

## 2020-03-28 DIAGNOSIS — S37812A Contusion of adrenal gland, initial encounter: Secondary | ICD-10-CM | POA: Diagnosis present

## 2020-03-28 DIAGNOSIS — S270XXD Traumatic pneumothorax, subsequent encounter: Secondary | ICD-10-CM | POA: Diagnosis not present

## 2020-03-28 DIAGNOSIS — Z781 Physical restraint status: Secondary | ICD-10-CM | POA: Diagnosis not present

## 2020-03-28 DIAGNOSIS — S32811A Multiple fractures of pelvis with unstable disruption of pelvic ring, initial encounter for closed fracture: Secondary | ICD-10-CM | POA: Diagnosis present

## 2020-03-28 DIAGNOSIS — Z6841 Body Mass Index (BMI) 40.0 and over, adult: Secondary | ICD-10-CM

## 2020-03-28 DIAGNOSIS — J9602 Acute respiratory failure with hypercapnia: Secondary | ICD-10-CM | POA: Diagnosis not present

## 2020-03-28 DIAGNOSIS — I959 Hypotension, unspecified: Secondary | ICD-10-CM

## 2020-03-28 DIAGNOSIS — S42352A Displaced comminuted fracture of shaft of humerus, left arm, initial encounter for closed fracture: Secondary | ICD-10-CM | POA: Diagnosis present

## 2020-03-28 DIAGNOSIS — J9690 Respiratory failure, unspecified, unspecified whether with hypoxia or hypercapnia: Secondary | ICD-10-CM

## 2020-03-28 DIAGNOSIS — Z4659 Encounter for fitting and adjustment of other gastrointestinal appliance and device: Secondary | ICD-10-CM

## 2020-03-28 DIAGNOSIS — Z0189 Encounter for other specified special examinations: Secondary | ICD-10-CM

## 2020-03-28 DIAGNOSIS — S2232XA Fracture of one rib, left side, initial encounter for closed fracture: Secondary | ICD-10-CM | POA: Diagnosis present

## 2020-03-28 DIAGNOSIS — R042 Hemoptysis: Secondary | ICD-10-CM | POA: Diagnosis not present

## 2020-03-28 DIAGNOSIS — R0902 Hypoxemia: Secondary | ICD-10-CM

## 2020-03-28 DIAGNOSIS — Z4682 Encounter for fitting and adjustment of non-vascular catheter: Secondary | ICD-10-CM

## 2020-03-28 DIAGNOSIS — T148XXA Other injury of unspecified body region, initial encounter: Secondary | ICD-10-CM

## 2020-03-28 DIAGNOSIS — I469 Cardiac arrest, cause unspecified: Secondary | ICD-10-CM | POA: Diagnosis not present

## 2020-03-28 LAB — URINALYSIS, ROUTINE W REFLEX MICROSCOPIC
Bilirubin Urine: NEGATIVE
Glucose, UA: NEGATIVE mg/dL
Ketones, ur: NEGATIVE mg/dL
Leukocytes,Ua: NEGATIVE
Nitrite: POSITIVE — AB
Protein, ur: 100 mg/dL — AB
RBC / HPF: >50 RBC/hpf — ABNORMAL HIGH (ref 0–5)
Specific Gravity, Urine: 1.02 (ref 1.005–1.030)
pH: 5 (ref 5.0–8.0)

## 2020-03-28 LAB — SAMPLE TO BLOOD BANK

## 2020-03-28 LAB — CBC
HCT: 45.2 % (ref 39.0–52.0)
Hemoglobin: 14.5 g/dL (ref 13.0–17.0)
MCH: 28.4 pg (ref 26.0–34.0)
MCHC: 32.1 g/dL (ref 30.0–36.0)
MCV: 88.5 fL (ref 80.0–100.0)
Platelets: 419 10*3/uL — ABNORMAL HIGH (ref 150–400)
RBC: 5.11 MIL/uL (ref 4.22–5.81)
RDW: 14.5 % (ref 11.5–15.5)
WBC: 25.6 10*3/uL — ABNORMAL HIGH (ref 4.0–10.5)
nRBC: 0 % (ref 0.0–0.2)

## 2020-03-28 LAB — COMPREHENSIVE METABOLIC PANEL
ALT: 131 U/L — ABNORMAL HIGH (ref 0–44)
AST: 145 U/L — ABNORMAL HIGH (ref 15–41)
Albumin: 3.3 g/dL — ABNORMAL LOW (ref 3.5–5.0)
Alkaline Phosphatase: 69 U/L (ref 38–126)
Anion gap: 10 (ref 5–15)
BUN: 9 mg/dL (ref 6–20)
CO2: 24 mmol/L (ref 22–32)
Calcium: 8.4 mg/dL — ABNORMAL LOW (ref 8.9–10.3)
Chloride: 105 mmol/L (ref 98–111)
Creatinine, Ser: 0.98 mg/dL (ref 0.61–1.24)
GFR, Estimated: >60 mL/min (ref >60)
Glucose, Bld: 153 mg/dL — ABNORMAL HIGH (ref 70–99)
Potassium: 4.3 mmol/L (ref 3.5–5.1)
Sodium: 139 mmol/L (ref 135–145)
Total Bilirubin: 0.6 mg/dL (ref 0.3–1.2)
Total Protein: 6.2 g/dL — ABNORMAL LOW (ref 6.5–8.1)

## 2020-03-28 LAB — LACTIC ACID, PLASMA: Lactic Acid, Venous: 2.6 mmol/L (ref 0.5–1.9)

## 2020-03-28 LAB — I-STAT CHEM 8, ED
BUN: 10 mg/dL (ref 6–20)
Calcium, Ion: 1.12 mmol/L — ABNORMAL LOW (ref 1.15–1.40)
Chloride: 104 mmol/L (ref 98–111)
Creatinine, Ser: 0.9 mg/dL (ref 0.61–1.24)
Glucose, Bld: 148 mg/dL — ABNORMAL HIGH (ref 70–99)
HCT: 44 % (ref 39.0–52.0)
Hemoglobin: 15 g/dL (ref 13.0–17.0)
Potassium: 4.2 mmol/L (ref 3.5–5.1)
Sodium: 140 mmol/L (ref 135–145)
TCO2: 25 mmol/L (ref 22–32)

## 2020-03-28 LAB — PROTIME-INR
INR: 1.1 (ref 0.8–1.2)
Prothrombin Time: 13.7 seconds (ref 11.4–15.2)

## 2020-03-28 LAB — RESPIRATORY PANEL BY RT PCR (FLU A&B, COVID)
Influenza A by PCR: NEGATIVE
Influenza B by PCR: NEGATIVE
SARS Coronavirus 2 by RT PCR: POSITIVE — AB

## 2020-03-28 LAB — PREPARE RBC (CROSSMATCH)

## 2020-03-28 LAB — ETHANOL: Alcohol, Ethyl (B): <10 mg/dL (ref <10)

## 2020-03-28 LAB — ABO/RH: ABO/RH(D): O POS

## 2020-03-28 MED ORDER — SODIUM CHLORIDE 0.9 % IV BOLUS
1000.0000 mL | Freq: Once | INTRAVENOUS | Status: AC
  Administered 2020-03-28: 1000 mL via INTRAVENOUS

## 2020-03-28 MED ORDER — PROCHLORPERAZINE EDISYLATE 10 MG/2ML IJ SOLN: 5.0000 mg | Freq: Four times a day (QID) | INTRAMUSCULAR | Status: DC | PRN

## 2020-03-28 MED ORDER — METHOCARBAMOL 1000 MG/10ML IJ SOLN
500.0000 mg | Freq: Four times a day (QID) | INTRAVENOUS | Status: DC | PRN
  Filled 2020-03-28 (×2): qty 5

## 2020-03-28 MED ORDER — HYDROMORPHONE HCL 1 MG/ML IJ SOLN: 1.0000 mg | Freq: Once | INTRAMUSCULAR | Status: AC

## 2020-03-28 MED ORDER — SODIUM CHLORIDE 0.9 % IV SOLN
10.0000 mL/h | Freq: Once | INTRAVENOUS | Status: AC
  Administered 2020-03-28: 10 mL/h via INTRAVENOUS

## 2020-03-28 MED ORDER — HYDROMORPHONE HCL 1 MG/ML IJ SOLN
INTRAMUSCULAR | Status: AC
  Administered 2020-03-28: 1 mg via INTRAVENOUS
  Filled 2020-03-28: qty 1

## 2020-03-28 MED ORDER — DEXTROSE 5 % IV SOLN
3.0000 g | Freq: Three times a day (TID) | INTRAVENOUS | Status: AC
  Administered 2020-03-29 (×2): 3 g via INTRAVENOUS
  Filled 2020-03-28 (×9): qty 3000

## 2020-03-28 MED ORDER — HYDROMORPHONE HCL 1 MG/ML IJ SOLN
1.0000 mg | Freq: Once | INTRAMUSCULAR | Status: AC
  Administered 2020-03-28: 1 mg via INTRAVENOUS

## 2020-03-28 MED ORDER — SODIUM CHLORIDE 0.9 % IV SOLN: INTRAVENOUS | Status: DC

## 2020-03-28 MED ORDER — ACETAMINOPHEN 500 MG PO TABS
1000.0000 mg | ORAL_TABLET | Freq: Four times a day (QID) | ORAL | Status: DC
  Administered 2020-03-28 – 2020-04-02 (×10): 1000 mg via ORAL
  Filled 2020-03-28 (×11): qty 2

## 2020-03-28 MED ORDER — IOHEXOL 300 MG/ML  SOLN
100.0000 mL | Freq: Once | INTRAMUSCULAR | Status: AC | PRN
  Administered 2020-03-28: 100 mL via INTRAVENOUS

## 2020-03-28 MED ORDER — TETANUS-DIPHTH-ACELL PERTUSSIS 5-2.5-18.5 LF-MCG/0.5 IM SUSP
0.5000 mL | Freq: Once | INTRAMUSCULAR | Status: AC
  Administered 2020-03-28: 0.5 mL via INTRAMUSCULAR

## 2020-03-28 MED ORDER — LIDOCAINE HCL (PF) 1 % IJ SOLN
INTRAMUSCULAR | Status: AC
  Administered 2020-03-28: 30 mL
  Filled 2020-03-28: qty 30

## 2020-03-28 MED ORDER — HYDROMORPHONE HCL 1 MG/ML IJ SOLN
INTRAMUSCULAR | Status: AC
  Filled 2020-03-28: qty 1

## 2020-03-28 MED ORDER — IOHEXOL 350 MG/ML SOLN
100.0000 mL | Freq: Once | INTRAVENOUS | Status: AC | PRN
  Administered 2020-03-28: 100 mL via INTRAVENOUS

## 2020-03-28 MED ORDER — PROCHLORPERAZINE MALEATE 5 MG PO TABS: 10.0000 mg | ORAL_TABLET | Freq: Four times a day (QID) | ORAL | Status: DC | PRN

## 2020-03-28 MED ORDER — ONDANSETRON HCL 4 MG/2ML IJ SOLN
4.0000 mg | Freq: Four times a day (QID) | INTRAMUSCULAR | Status: DC | PRN
  Administered 2020-03-31: 4 mg via INTRAVENOUS
  Filled 2020-03-28: qty 2

## 2020-03-28 NOTE — Progress Notes (Signed)
Orthopedic Tech Progress Note Patient Details:  Kipp Shank [birth year] [id number] Level 2 trauma, upgraded to a level 1 Patient ID: Andrews Tener, male   DOB: July 09, 1975, 44 y.o.   MRN: [id number]   Donald Pore 03/07/2020, 4:46 PM

## 2020-03-28 NOTE — ED Notes (Signed)
Dr. Erven Colla at bedside

## 2020-03-28 NOTE — ED Notes (Signed)
Unable to obtain temporal temp due to injuries, attempted oral temp without success

## 2020-03-28 NOTE — ED Notes (Signed)
RN called x-ray for portable scans. X-ray told this RN that they will come to room now.

## 2020-03-28 NOTE — ED Notes (Addendum)
RN along with another RN unable to palp or doppler L pedal or posterior tib pulses. Cap refill >3 seconds. R foot pulses +1 palp.  RN paged oncall trauma MD.

## 2020-03-28 NOTE — ED Notes (Signed)
Ortho PA states that he did find pedal pulse dopplered and will reassess once pt is admitted to floor

## 2020-03-28 NOTE — Consult Note (Addendum)
[facility]opaedic [facility]   Aware of consult at 1645 after initial trauma series completed Made aware of pelvic ring injury  Binder applied Pt going to CT/radiology for additional imaging  OTS in OR addressing additional trauma patients Full consult to follow   Mearl Latin, PA-C [phone] (C) 03/18/2020, 7:45 PM  [facility] [address] [place] [address] [phone] Collier Bullock (F)

## 2020-03-28 NOTE — ED Notes (Signed)
Difficulty obtaining second Iv access
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
## 2020-03-28 NOTE — ED Notes (Signed)
RN called ortho tech to place sling
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
## 2020-03-28 NOTE — Progress Notes (Signed)
Orthopedic Tech Progress Note Patient Details:  Tyler Guerrero [birth year] [id number]  Ortho Devices Type of Ortho Device: Arm sling, Coapt Ortho Device/Splint Location: lue. [place] assisted Ortho Device/Splint Interventions: Ordered, Application, Adjustment   Post Interventions Patient Tolerated: Well Instructions Provided: Care of device, Adjustment of device   Trinna Post 17-Apr-2020, 9:18 PM
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
## 2020-03-28 NOTE — ED Notes (Signed)
Pelvic binder applied by trauma team

## 2020-03-28 NOTE — ED Notes (Signed)
Additional warm blankets placed on pt's lower extremities

## 2020-03-29 ENCOUNTER — Inpatient Hospital Stay (HOSPITAL_COMMUNITY): Payer: Managed Care, Other (non HMO)

## 2020-03-29 ENCOUNTER — Inpatient Hospital Stay (HOSPITAL_COMMUNITY): Payer: Managed Care, Other (non HMO) | Admitting: Certified Registered Nurse Anesthetist

## 2020-03-29 ENCOUNTER — Encounter (HOSPITAL_COMMUNITY): Admission: EM | Disposition: E | Payer: Self-pay | Source: Home / Self Care

## 2020-03-29 HISTORY — PX: SACRO-ILIAC PINNING: SHX5050

## 2020-03-29 HISTORY — PX: ORIF PELVIC FRACTURE: SHX2128

## 2020-03-29 HISTORY — PX: ORIF HUMERUS FRACTURE: SHX2126

## 2020-03-29 LAB — POCT I-STAT, CHEM 8
BUN: 20 mg/dL (ref 6–20)
Calcium, Ion: 1.13 mmol/L — ABNORMAL LOW (ref 1.15–1.40)
Chloride: 109 mmol/L (ref 98–111)
Creatinine, Ser: 1.2 mg/dL (ref 0.61–1.24)
Glucose, Bld: 154 mg/dL — ABNORMAL HIGH (ref 70–99)
HCT: 29 % — ABNORMAL LOW (ref 39.0–52.0)
Hemoglobin: 9.9 g/dL — ABNORMAL LOW (ref 13.0–17.0)
Potassium: 5.1 mmol/L (ref 3.5–5.1)
Sodium: 142 mmol/L (ref 135–145)
TCO2: 24 mmol/L (ref 22–32)

## 2020-03-29 LAB — POCT I-STAT 7, (LYTES, BLD GAS, ICA,H+H)
Acid-base deficit: 7 mmol/L — ABNORMAL HIGH (ref 0.0–2.0)
Acid-base deficit: 6 mmol/L — ABNORMAL HIGH (ref 0.0–2.0)
Acid-base deficit: 5 mmol/L — ABNORMAL HIGH (ref 0.0–2.0)
Acid-base deficit: 4 mmol/L — ABNORMAL HIGH (ref 0.0–2.0)
Acid-base deficit: 3 mmol/L — ABNORMAL HIGH (ref 0.0–2.0)
Bicarbonate: 22.9 mmol/L (ref 20.0–28.0)
Bicarbonate: 22.3 mmol/L (ref 20.0–28.0)
Bicarbonate: 23.1 mmol/L (ref 20.0–28.0)
Bicarbonate: 22.9 mmol/L (ref 20.0–28.0)
Bicarbonate: 24 mmol/L (ref 20.0–28.0)
Calcium, Ion: 1.17 mmol/L (ref 1.15–1.40)
Calcium, Ion: 1.1 mmol/L — ABNORMAL LOW (ref 1.15–1.40)
Calcium, Ion: 1.12 mmol/L — ABNORMAL LOW (ref 1.15–1.40)
Calcium, Ion: 1.14 mmol/L — ABNORMAL LOW (ref 1.15–1.40)
Calcium, Ion: 1.12 mmol/L — ABNORMAL LOW (ref 1.15–1.40)
HCT: 37 % — ABNORMAL LOW (ref 39.0–52.0)
HCT: 32 % — ABNORMAL LOW (ref 39.0–52.0)
HCT: 30 % — ABNORMAL LOW (ref 39.0–52.0)
HCT: 30 % — ABNORMAL LOW (ref 39.0–52.0)
HCT: 28 % — ABNORMAL LOW (ref 39.0–52.0)
Hemoglobin: 12.6 g/dL — ABNORMAL LOW (ref 13.0–17.0)
Hemoglobin: 10.9 g/dL — ABNORMAL LOW (ref 13.0–17.0)
Hemoglobin: 10.2 g/dL — ABNORMAL LOW (ref 13.0–17.0)
Hemoglobin: 10.2 g/dL — ABNORMAL LOW (ref 13.0–17.0)
Hemoglobin: 9.5 g/dL — ABNORMAL LOW (ref 13.0–17.0)
O2 Saturation: 92 %
O2 Saturation: 96 %
O2 Saturation: 96 %
O2 Saturation: 95 %
O2 Saturation: 94 %
Patient temperature: 35.5
Patient temperature: 36.1
Patient temperature: 37
Potassium: 5.1 mmol/L (ref 3.5–5.1)
Potassium: 5.1 mmol/L (ref 3.5–5.1)
Potassium: 5.2 mmol/L — ABNORMAL HIGH (ref 3.5–5.1)
Potassium: 5 mmol/L (ref 3.5–5.1)
Potassium: 5.1 mmol/L (ref 3.5–5.1)
Sodium: 143 mmol/L (ref 135–145)
Sodium: 143 mmol/L (ref 135–145)
Sodium: 143 mmol/L (ref 135–145)
Sodium: 142 mmol/L (ref 135–145)
Sodium: 143 mmol/L (ref 135–145)
TCO2: 25 mmol/L (ref 22–32)
TCO2: 24 mmol/L (ref 22–32)
TCO2: 25 mmol/L (ref 22–32)
TCO2: 24 mmol/L (ref 22–32)
TCO2: 26 mmol/L (ref 22–32)
pCO2 arterial: 64.8 mmHg — ABNORMAL HIGH (ref 32.0–48.0)
pCO2 arterial: 51 mmHg — ABNORMAL HIGH (ref 32.0–48.0)
pCO2 arterial: 52 mmHg — ABNORMAL HIGH (ref 32.0–48.0)
pCO2 arterial: 50.8 mmHg — ABNORMAL HIGH (ref 32.0–48.0)
pCO2 arterial: 52 mmHg — ABNORMAL HIGH (ref 32.0–48.0)
pH, Arterial: 7.155 — CL (ref 7.350–7.450)
pH, Arterial: 7.241 — ABNORMAL LOW (ref 7.350–7.450)
pH, Arterial: 7.251 — ABNORMAL LOW (ref 7.350–7.450)
pH, Arterial: 7.262 — ABNORMAL LOW (ref 7.350–7.450)
pH, Arterial: 7.273 — ABNORMAL LOW (ref 7.350–7.450)
pO2, Arterial: 84 mmHg (ref 83.0–108.0)
pO2, Arterial: 95 mmHg (ref 83.0–108.0)
pO2, Arterial: 90 mmHg (ref 83.0–108.0)
pO2, Arterial: 85 mmHg (ref 83.0–108.0)
pO2, Arterial: 82 mmHg — ABNORMAL LOW (ref 83.0–108.0)

## 2020-03-29 LAB — CBC WITH DIFFERENTIAL/PLATELET
Abs Immature Granulocytes: 0.05 10*3/uL (ref 0.00–0.07)
Basophils Absolute: 0 10*3/uL (ref 0.0–0.1)
Basophils Relative: 0 %
Eosinophils Absolute: 0 10*3/uL (ref 0.0–0.5)
Eosinophils Relative: 0 %
HCT: 30.9 % — ABNORMAL LOW (ref 39.0–52.0)
Hemoglobin: 10 g/dL — ABNORMAL LOW (ref 13.0–17.0)
Immature Granulocytes: 1 %
Lymphocytes Relative: 7 %
Lymphs Abs: 0.7 10*3/uL (ref 0.7–4.0)
MCH: 28.6 pg (ref 26.0–34.0)
MCHC: 32.4 g/dL (ref 30.0–36.0)
MCV: 88.3 fL (ref 80.0–100.0)
Monocytes Absolute: 0.9 10*3/uL (ref 0.1–1.0)
Monocytes Relative: 9 %
Neutro Abs: 8.9 10*3/uL — ABNORMAL HIGH (ref 1.7–7.7)
Neutrophils Relative %: 83 %
Platelets: 185 10*3/uL (ref 150–400)
RBC: 3.5 MIL/uL — ABNORMAL LOW (ref 4.22–5.81)
RDW: 15.5 % (ref 11.5–15.5)
WBC: 10.6 10*3/uL — ABNORMAL HIGH (ref 4.0–10.5)
nRBC: 0 % (ref 0.0–0.2)

## 2020-03-29 LAB — BASIC METABOLIC PANEL
Anion gap: 11 (ref 5–15)
BUN: 15 mg/dL (ref 6–20)
CO2: 19 mmol/L — ABNORMAL LOW (ref 22–32)
Calcium: 8 mg/dL — ABNORMAL LOW (ref 8.9–10.3)
Chloride: 109 mmol/L (ref 98–111)
Creatinine, Ser: 1.37 mg/dL — ABNORMAL HIGH (ref 0.61–1.24)
GFR, Estimated: >60 mL/min (ref >60)
Glucose, Bld: 174 mg/dL — ABNORMAL HIGH (ref 70–99)
Potassium: 4.5 mmol/L (ref 3.5–5.1)
Sodium: 139 mmol/L (ref 135–145)

## 2020-03-29 LAB — MRSA PCR SCREENING: MRSA by PCR: NEGATIVE

## 2020-03-29 LAB — BLOOD PRODUCT ORDER (VERBAL) VERIFICATION

## 2020-03-29 LAB — HIV ANTIBODY (ROUTINE TESTING W REFLEX): HIV Screen 4th Generation wRfx: NONREACTIVE

## 2020-03-29 LAB — CBC
HCT: 41.1 % (ref 39.0–52.0)
Hemoglobin: 13.3 g/dL (ref 13.0–17.0)
MCH: 28.4 pg (ref 26.0–34.0)
MCHC: 32.4 g/dL (ref 30.0–36.0)
MCV: 87.6 fL (ref 80.0–100.0)
Platelets: 268 10*3/uL (ref 150–400)
RBC: 4.69 MIL/uL (ref 4.22–5.81)
RDW: 15 % (ref 11.5–15.5)
WBC: 15.1 10*3/uL — ABNORMAL HIGH (ref 4.0–10.5)
nRBC: 0 % (ref 0.0–0.2)

## 2020-03-29 LAB — PROTIME-INR
INR: 1.3 — ABNORMAL HIGH (ref 0.8–1.2)
Prothrombin Time: 15.7 seconds — ABNORMAL HIGH (ref 11.4–15.2)

## 2020-03-29 LAB — GLUCOSE, CAPILLARY: Glucose-Capillary: 168 mg/dL — ABNORMAL HIGH (ref 70–99)

## 2020-03-29 SURGERY — PINNING, SACROILIAC JOINT, PERCUTANEOUS
Anesthesia: General | Site: Pelvis

## 2020-03-29 MED ORDER — ONDANSETRON HCL 4 MG/2ML IJ SOLN
INTRAMUSCULAR | Status: DC | PRN
  Administered 2020-03-29: 4 mg via INTRAVENOUS

## 2020-03-29 MED ORDER — NOREPINEPHRINE 4 MG/250ML-% IV SOLN: 2.0000 ug/min | INTRAVENOUS | Status: DC

## 2020-03-29 MED ORDER — ROCURONIUM BROMIDE 10 MG/ML (PF) SYRINGE
PREFILLED_SYRINGE | INTRAVENOUS | Status: DC | PRN
  Administered 2020-03-29: 30 mg via INTRAVENOUS
  Administered 2020-03-29: 100 mg via INTRAVENOUS
  Administered 2020-03-29: 20 mg via INTRAVENOUS
  Administered 2020-03-29 (×3): 50 mg via INTRAVENOUS
  Administered 2020-03-29: 30 mg via INTRAVENOUS
  Administered 2020-03-29: 20 mg via INTRAVENOUS
  Administered 2020-03-29: 50 mg via INTRAVENOUS

## 2020-03-29 MED ORDER — DEXAMETHASONE SODIUM PHOSPHATE 10 MG/ML IJ SOLN
INTRAMUSCULAR | Status: DC | PRN
  Administered 2020-03-29: 5 mg via INTRAVENOUS

## 2020-03-29 MED ORDER — LACTATED RINGERS IV SOLN: INTRAVENOUS | Status: DC | PRN

## 2020-03-29 MED ORDER — FENTANYL CITRATE (PF) 250 MCG/5ML IJ SOLN
INTRAMUSCULAR | Status: AC
  Filled 2020-03-29: qty 5

## 2020-03-29 MED ORDER — PHENYLEPHRINE 40 MCG/ML (10ML) SYRINGE FOR IV PUSH (FOR BLOOD PRESSURE SUPPORT)
PREFILLED_SYRINGE | INTRAVENOUS | Status: DC | PRN
  Administered 2020-03-29 (×2): 80 ug via INTRAVENOUS
  Administered 2020-03-29: 120 ug via INTRAVENOUS

## 2020-03-29 MED ORDER — CEFAZOLIN SODIUM 1 G IJ SOLR
INTRAMUSCULAR | Status: AC
  Filled 2020-03-29: qty 40

## 2020-03-29 MED ORDER — ENOXAPARIN SODIUM 40 MG/0.4ML ~~LOC~~ SOLN: 40.0000 mg | SUBCUTANEOUS | Status: DC

## 2020-03-29 MED ORDER — CALCIUM CHLORIDE 10 % IV SOLN
INTRAVENOUS | Status: DC | PRN
  Administered 2020-03-29 (×2): 200 mg via INTRAVENOUS
  Administered 2020-03-29 (×2): 300 mg via INTRAVENOUS

## 2020-03-29 MED ORDER — LIDOCAINE 2% (20 MG/ML) 5 ML SYRINGE
INTRAMUSCULAR | Status: AC
  Filled 2020-03-29: qty 5

## 2020-03-29 MED ORDER — PROPOFOL 10 MG/ML IV BOLUS
INTRAVENOUS | Status: AC
  Filled 2020-03-29: qty 40

## 2020-03-29 MED ORDER — ENOXAPARIN SODIUM 40 MG/0.4ML ~~LOC~~ SOLN
40.0000 mg | SUBCUTANEOUS | Status: DC
  Administered 2020-03-30 – 2020-03-31 (×2): 40 mg via SUBCUTANEOUS
  Filled 2020-03-29 (×2): qty 0.4

## 2020-03-29 MED ORDER — CHLORHEXIDINE GLUCONATE CLOTH 2 % EX PADS
6.0000 | MEDICATED_PAD | Freq: Every day | CUTANEOUS | Status: DC
  Administered 2020-03-30 – 2020-04-02 (×4): 6 via TOPICAL

## 2020-03-29 MED ORDER — SODIUM CHLORIDE 0.9 % IV SOLN: Freq: Once | INTRAVENOUS | Status: AC

## 2020-03-29 MED ORDER — STERILE WATER FOR IRRIGATION IR SOLN
Status: DC | PRN
  Administered 2020-03-29: 1000 mL

## 2020-03-29 MED ORDER — HYDROMORPHONE HCL 1 MG/ML IJ SOLN
1.0000 mg | INTRAMUSCULAR | Status: DC | PRN
  Administered 2020-03-29 (×2): 2 mg via INTRAVENOUS
  Administered 2020-03-30 (×2): 1 mg via INTRAVENOUS
  Filled 2020-03-29: qty 2
  Filled 2020-03-29: qty 1
  Filled 2020-03-29 (×2): qty 2

## 2020-03-29 MED ORDER — PROPOFOL 500 MG/50ML IV EMUL
INTRAVENOUS | Status: DC | PRN
  Administered 2020-03-29: 50 ug/kg/min via INTRAVENOUS

## 2020-03-29 MED ORDER — FUROSEMIDE 10 MG/ML IJ SOLN
INTRAMUSCULAR | Status: DC | PRN
  Administered 2020-03-29 (×2): 10 mg via INTRAMUSCULAR

## 2020-03-29 MED ORDER — DEXAMETHASONE SODIUM PHOSPHATE 10 MG/ML IJ SOLN
INTRAMUSCULAR | Status: AC
  Filled 2020-03-29: qty 1

## 2020-03-29 MED ORDER — 0.9 % SODIUM CHLORIDE (POUR BTL) OPTIME
TOPICAL | Status: DC | PRN
  Administered 2020-03-29: 2000 mL

## 2020-03-29 MED ORDER — ALBUMIN HUMAN 5 % IV SOLN: INTRAVENOUS | Status: DC | PRN

## 2020-03-29 MED ORDER — LIDOCAINE 5 % EX PTCH
2.0000 | MEDICATED_PATCH | Freq: Every day | CUTANEOUS | Status: DC
  Administered 2020-03-29: 2 via TRANSDERMAL
  Filled 2020-03-29 (×5): qty 2

## 2020-03-29 MED ORDER — INSULIN ASPART 100 UNIT/ML ~~LOC~~ SOLN
0.0000 [IU] | Freq: Three times a day (TID) | SUBCUTANEOUS | Status: DC
  Administered 2020-03-30 – 2020-04-02 (×9): 3 [IU] via SUBCUTANEOUS
  Administered 2020-04-02: 4 [IU] via SUBCUTANEOUS
  Administered 2020-04-02: 3 [IU] via SUBCUTANEOUS

## 2020-03-29 MED ORDER — SUGAMMADEX SODIUM 500 MG/5ML IV SOLN
INTRAVENOUS | Status: AC
  Filled 2020-03-29: qty 5

## 2020-03-29 MED ORDER — ROCURONIUM BROMIDE 10 MG/ML (PF) SYRINGE
PREFILLED_SYRINGE | INTRAVENOUS | Status: AC
  Filled 2020-03-29: qty 30

## 2020-03-29 MED ORDER — FAMOTIDINE 20 MG PO TABS
20.0000 mg | ORAL_TABLET | Freq: Every day | ORAL | Status: DC
  Administered 2020-03-31 – 2020-04-02 (×3): 20 mg via ORAL
  Filled 2020-03-29 (×4): qty 1

## 2020-03-29 MED ORDER — MIDAZOLAM HCL 2 MG/2ML IJ SOLN
INTRAMUSCULAR | Status: AC
  Filled 2020-03-29: qty 2

## 2020-03-29 MED ORDER — SODIUM CHLORIDE 0.9 % IV SOLN: 250.0000 mL | INTRAVENOUS | Status: DC

## 2020-03-29 MED ORDER — DEXTROSE 5 % IV SOLN
3.0000 g | Freq: Three times a day (TID) | INTRAVENOUS | Status: AC
  Administered 2020-03-29 – 2020-03-30 (×3): 3 g via INTRAVENOUS
  Filled 2020-03-29 (×4): qty 3000

## 2020-03-29 MED ORDER — DEXTROSE 5 % IV SOLN
INTRAVENOUS | Status: DC | PRN
  Administered 2020-03-29 (×2): 3 g via INTRAVENOUS

## 2020-03-29 MED ORDER — PHENYLEPHRINE HCL-NACL 10-0.9 MG/250ML-% IV SOLN
INTRAVENOUS | Status: DC | PRN
  Administered 2020-03-29: 10 ug/min via INTRAVENOUS

## 2020-03-29 MED ORDER — PHENYLEPHRINE 40 MCG/ML (10ML) SYRINGE FOR IV PUSH (FOR BLOOD PRESSURE SUPPORT)
PREFILLED_SYRINGE | INTRAVENOUS | Status: AC
  Filled 2020-03-29: qty 10

## 2020-03-29 MED ORDER — PROPOFOL 10 MG/ML IV BOLUS
INTRAVENOUS | Status: DC | PRN
  Administered 2020-03-29: 200 mg via INTRAVENOUS

## 2020-03-29 MED ORDER — ONDANSETRON HCL 4 MG/2ML IJ SOLN
INTRAMUSCULAR | Status: AC
  Filled 2020-03-29: qty 2

## 2020-03-29 MED ORDER — FENTANYL CITRATE (PF) 250 MCG/5ML IJ SOLN
INTRAMUSCULAR | Status: DC | PRN
  Administered 2020-03-29 (×7): 50 ug via INTRAVENOUS
  Administered 2020-03-29: 100 ug via INTRAVENOUS
  Administered 2020-03-29 (×4): 50 ug via INTRAVENOUS

## 2020-03-29 MED ORDER — VASOPRESSIN 20 UNIT/ML IV SOLN
INTRAVENOUS | Status: DC | PRN
  Administered 2020-03-29: 3 [IU] via INTRAVENOUS
  Administered 2020-03-29: 2 [IU] via INTRAVENOUS
  Administered 2020-03-29: 4 [IU] via INTRAVENOUS

## 2020-03-29 MED ORDER — SODIUM BICARBONATE 8.4 % IV SOLN
INTRAVENOUS | Status: DC | PRN
  Administered 2020-03-29 (×2): 25 meq via INTRAVENOUS

## 2020-03-29 MED ORDER — PROPOFOL 1000 MG/100ML IV EMUL
5.0000 ug/kg/min | INTRAVENOUS | Status: DC
  Administered 2020-03-29: 30 ug/kg/min via INTRAVENOUS
  Administered 2020-03-30 (×2): 40 ug/kg/min via INTRAVENOUS
  Administered 2020-03-30: 30 ug/kg/min via INTRAVENOUS
  Administered 2020-03-30: 40 ug/kg/min via INTRAVENOUS
  Filled 2020-03-29 (×2): qty 200
  Filled 2020-03-29 (×2): qty 100

## 2020-03-29 MED ORDER — FENTANYL 2500MCG IN NS 250ML (10MCG/ML) PREMIX INFUSION
0.0000 ug/h | INTRAVENOUS | Status: DC
  Administered 2020-03-29: 50 ug/h via INTRAVENOUS
  Administered 2020-03-30: 150 ug/h via INTRAVENOUS
  Filled 2020-03-29 (×2): qty 250

## 2020-03-29 MED ORDER — MIDAZOLAM HCL 2 MG/2ML IJ SOLN
INTRAMUSCULAR | Status: DC | PRN
  Administered 2020-03-29: 2 mg via INTRAVENOUS

## 2020-03-29 MED ORDER — VASOPRESSIN 20 UNIT/ML IV SOLN
INTRAVENOUS | Status: AC
  Filled 2020-03-29: qty 1

## 2020-03-29 MED ORDER — LIDOCAINE 2% (20 MG/ML) 5 ML SYRINGE
INTRAMUSCULAR | Status: DC | PRN
  Administered 2020-03-29: 100 mg via INTRAVENOUS

## 2020-03-29 MED ORDER — SODIUM CHLORIDE 0.9 % IV SOLN: INTRAVENOUS | Status: DC | PRN

## 2020-03-29 MED ORDER — VANCOMYCIN HCL 1000 MG IV SOLR
INTRAVENOUS | Status: DC | PRN
  Administered 2020-03-29 (×2): 1000 mg via TOPICAL

## 2020-03-29 SURGICAL SUPPLY — 109 items
BIT DRILL 2.5X110 QC LCP DISP (BIT) ×2 IMPLANT
BIT DRILL 2.8 (BIT) ×2
BIT DRILL 4.8X200 CANN (BIT) ×2 IMPLANT
BIT DRILL 4.8X300 (BIT) ×2 IMPLANT
BIT DRILL 4.8X300MM (BIT) ×1
BIT DRILL AO MATTA 2.5MX230M (BIT) IMPLANT
BIT DRILL CALIB QC 170X80 (BIT) ×2 IMPLANT
BIT DRILL CANN QC 2.8X165 (BIT) IMPLANT
BIT DRILL LONG 2.7 (BIT) IMPLANT
BIT DRILL QC 2X125 (BIT) ×1 IMPLANT
BIT DRILL QC 2X140 (BIT) ×2 IMPLANT
BIT DRILL QC SFS 2.5X170 (BIT) ×2 IMPLANT
BNDG COHESIVE 4X5 TAN STRL (GAUZE/BANDAGES/DRESSINGS) ×3 IMPLANT
BRUSH SCRUB EZ PLAIN DRY (MISCELLANEOUS) ×8 IMPLANT
CNTNR URN SCR LID CUP LEK RST (MISCELLANEOUS) IMPLANT
CONT SPEC 4OZ STRL OR WHT (MISCELLANEOUS) ×4
COVER SURGICAL LIGHT HANDLE (MISCELLANEOUS) ×7 IMPLANT
COVER WAND RF STERILE (DRAPES) ×4 IMPLANT
DRAPE C-ARM 42X72 X-RAY (DRAPES) IMPLANT
DRAPE C-ARMOR (DRAPES) ×4 IMPLANT
DRAPE INCISE IOBAN 66X45 STRL (DRAPES) ×6 IMPLANT
DRAPE LAPAROTOMY TRNSV 102X78 (DRAPES) ×4 IMPLANT
DRAPE ORTHO SPLIT 77X108 STRL (DRAPES) ×8
DRAPE SURG ORHT 6 SPLT 77X108 (DRAPES) IMPLANT
DRAPE U-SHAPE 47X51 STRL (DRAPES) ×4 IMPLANT
DRILL BIT 2.8MM (BIT) ×4
DRILL BIT AO MATTA 2.5MX230M (BIT) ×4
DRILL BIT LONG 2.7 (BIT) ×4
DRILL BIT QC 2X125 (BIT) ×2
DRSG MEPILEX BORDER 4X12 (GAUZE/BANDAGES/DRESSINGS) ×2 IMPLANT
DRSG MEPILEX BORDER 4X4 (GAUZE/BANDAGES/DRESSINGS) ×4 IMPLANT
DRSG MEPILEX BORDER 4X8 (GAUZE/BANDAGES/DRESSINGS) ×2 IMPLANT
ELECT BLADE 6.5 EXT (BLADE) ×2 IMPLANT
ELECT REM PT RETURN 9FT ADLT (ELECTROSURGICAL) ×4
ELECTRODE REM PT RTRN 9FT ADLT (ELECTROSURGICAL) ×2 IMPLANT
GLOVE BIO SURGEON STRL SZ7.5 (GLOVE) ×14 IMPLANT
GLOVE BIO SURGEON STRL SZ8 (GLOVE) ×14 IMPLANT
GLOVE BIOGEL PI IND STRL 7.5 (GLOVE) ×2 IMPLANT
GLOVE BIOGEL PI IND STRL 8 (GLOVE) ×2 IMPLANT
GLOVE BIOGEL PI INDICATOR 7.5 (GLOVE) ×4
GLOVE BIOGEL PI INDICATOR 8 (GLOVE) ×4
GOWN STRL REUS W/ TWL LRG LVL3 (GOWN DISPOSABLE) ×4 IMPLANT
GOWN STRL REUS W/ TWL XL LVL3 (GOWN DISPOSABLE) ×2 IMPLANT
GOWN STRL REUS W/TWL LRG LVL3 (GOWN DISPOSABLE) ×8
GOWN STRL REUS W/TWL XL LVL3 (GOWN DISPOSABLE) ×4
K-WIRE .062 (WIRE) ×4
K-WIRE FX6X.062X2 END TROC (WIRE) ×2
KIT BASIN OR (CUSTOM PROCEDURE TRAY) ×4 IMPLANT
KIT TURNOVER KIT B (KITS) ×4 IMPLANT
KWIRE FX6X.062X2 END TROC (WIRE) IMPLANT
MANIFOLD NEPTUNE II (INSTRUMENTS) ×4 IMPLANT
NS IRRIG 1000ML POUR BTL (IV SOLUTION) ×4 IMPLANT
PACK GENERAL/GYN (CUSTOM PROCEDURE TRAY) ×4 IMPLANT
PACK ORTHO EXTREMITY (CUSTOM PROCEDURE TRAY) ×3 IMPLANT
PAD ARMBOARD 7.5X6 YLW CONV (MISCELLANEOUS) ×12 IMPLANT
PIN GUIDE DRILL TIP 2.8X300 (DRILL) ×2 IMPLANT
PLATE LCP 16 HOLE 3.5 (Plate) ×2 IMPLANT
PLATE SYMPHYSIS 92.5M 6H (Plate) ×2 IMPLANT
RETRACTOR ONETRAX LX 135X30 (MISCELLANEOUS) ×2 IMPLANT
RETRACTOR ONETRAX LX 90X20 (MISCELLANEOUS) ×2 IMPLANT
SCREW CANN 8.0X150X40 (Screw) ×2 IMPLANT
SCREW CANN 8.0X170X40 (Screw) ×2 IMPLANT
SCREW CORT 2.5XFT 44X3.5XST (Screw) IMPLANT
SCREW CORT ST 2.7X10 (Screw) ×4 IMPLANT
SCREW CORTEX 2.7 26MM (Screw) ×2 IMPLANT
SCREW CORTEX 2.7X12MM (Screw) ×4 IMPLANT
SCREW CORTEX 3.5 12MM (Screw) ×2 IMPLANT
SCREW CORTEX 3.5 24MM (Screw) ×2 IMPLANT
SCREW CORTEX 3.5 28MM (Screw) ×2 IMPLANT
SCREW CORTEX 3.5 36MM (Screw) ×2 IMPLANT
SCREW CORTEX 3.5 40MM (Screw) ×2 IMPLANT
SCREW CORTEX ST MATTA 3.5X26MM (Screw) ×2 IMPLANT
SCREW CORTEX ST MATTA 3.5X34MM (Screw) ×2 IMPLANT
SCREW CORTEX ST MATTA 3.5X38M (Screw) ×2 IMPLANT
SCREW CORTEX ST MATTA 3.5X40MM (Screw) ×3 IMPLANT
SCREW CORTEX ST MATTA 3.5X55MM (Screw) ×2 IMPLANT
SCREW CORTICAL 3.5X44MM (Screw) ×4 IMPLANT
SCREW LOCK 3.5X44 ST (Screw) ×4 IMPLANT
SCREW LOCK CORT ST 3.5X12 (Screw) IMPLANT
SCREW LOCK CORT ST 3.5X24 (Screw) IMPLANT
SCREW LOCK CORT ST 3.5X28 (Screw) IMPLANT
SCREW LOCK CORT ST 3.5X36 (Screw) IMPLANT
SCREW LOCK CORT ST 3.5X40 (Screw) IMPLANT
SCREW LOCK T15 FT 40X3.5XST (Screw) IMPLANT
SCREW LOCKING 3.5X26 (Screw) ×6 IMPLANT
SCREW LOCKING 3.5X40 (Screw) ×4 IMPLANT
SLING ARM IMMOBILIZER XL (CAST SUPPLIES) ×3 IMPLANT
STAPLER VISISTAT 35W (STAPLE) ×4 IMPLANT
STOCKINETTE IMPERVIOUS LG (DRAPES) ×2 IMPLANT
SUT ETHILON 2 0 FS 18 (SUTURE) ×6 IMPLANT
SUT ETHILON 3 0 PS 1 (SUTURE) ×10 IMPLANT
SUT VIC AB 0 CT1 27 (SUTURE) ×16
SUT VIC AB 0 CT1 27XBRD ANBCTR (SUTURE) IMPLANT
SUT VIC AB 1 CT1 18XCR BRD 8 (SUTURE) IMPLANT
SUT VIC AB 1 CT1 27 (SUTURE) ×8
SUT VIC AB 1 CT1 27XBRD ANBCTR (SUTURE) IMPLANT
SUT VIC AB 1 CT1 8-18 (SUTURE) ×4
SUT VIC AB 2-0 CT1 (SUTURE) ×2 IMPLANT
SUT VIC AB 2-0 CT1 27 (SUTURE) ×8
SUT VIC AB 2-0 CT1 TAPERPNT 27 (SUTURE) IMPLANT
SUT VIC AB 2-0 FS1 27 (SUTURE) ×4 IMPLANT
SYR BULB IRRIG 60ML STRL (SYRINGE) ×2 IMPLANT
TOWEL GREEN STERILE (TOWEL DISPOSABLE) ×8 IMPLANT
TOWEL GREEN STERILE FF (TOWEL DISPOSABLE) ×4 IMPLANT
TUBE CONNECTING 12'X1/4 (SUCTIONS) ×1
TUBE CONNECTING 12X1/4 (SUCTIONS) ×1 IMPLANT
UNDERPAD 30X36 HEAVY ABSORB (UNDERPADS AND DIAPERS) ×14 IMPLANT
WATER STERILE IRR 1000ML POUR (IV SOLUTION) ×4 IMPLANT
YANKAUER SUCT BULB TIP NO VENT (SUCTIONS) ×3 IMPLANT
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
## 2020-03-29 NOTE — Progress Notes (Signed)
Pt not getting volumes, low MVe, and very tachypneic. Per MD Tseui tube advanced 4cm. Obtain another xray to evaluate. Pts volumes, MVe and RR are within normal limits. Pt BS on right clear, left side none noted MD aware and awaiting Radiologist to read xray for further instructions. RT will continue to monitor.
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
## 2020-03-29 NOTE — Anesthesia Postprocedure Evaluation (Signed)
Anesthesia Post Note  Patient: Tyler Guerrero  Procedure(s) Performed: SACRO-ILIAC SCREW FIXATION (N/A Pelvis) OPEN REDUCTION INTERNAL FIXATION (ORIF) PROXIMAL HUMERUS FRACTURE (Left Arm Upper) OPEN REDUCTION INTERNAL FIXATION (ORIF) PELVIC FRACTURE (N/A Pelvis)     Patient location during evaluation: SICU Anesthesia Type: General Level of consciousness: sedated Pain management: pain level controlled Vital Signs Assessment: post-procedure vital signs reviewed and stable Respiratory status: patient remains intubated per anesthesia plan Cardiovascular status: stable Postop Assessment: no apparent nausea or vomiting Anesthetic complications: no   No complications documented.  Last Vitals:  Vitals:   03/20/2020 0900 03/20/2020 1900  BP: 105/73 (!) 119/52  Pulse: (!) 113 (!) 122  Resp: 13 (!) 34  Temp:  37.2 C  SpO2: 93% 93%    Last Pain:  Vitals:   03/18/2020 1900  TempSrc: Axillary  PainSc:                  Nelle Don Flynn Lininger
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
## 2020-03-29 NOTE — Anesthesia Procedure Notes (Signed)
Procedure Name: Intubation Date/Time: 03/31/2020 9:56 AM Performed by: Verdie Drown, CRNA Pre-anesthesia Checklist: Patient identified, Emergency Drugs available, Suction available, Patient being monitored and Timeout performed Patient Re-evaluated:Patient Re-evaluated prior to induction Oxygen Delivery Method: Circle system utilized Preoxygenation: Pre-oxygenation with 100% oxygen Induction Type: IV induction, Rapid sequence and Cricoid Pressure applied Ventilation: Two handed mask ventilation required, Oral airway inserted - appropriate to patient size and Mask ventilation without difficulty Laryngoscope Size: Glidescope, 4 and Mac Grade View: Grade I Tube type: Oral Tube size: 8.0 mm Number of attempts: 1 Airway Equipment and Method: Stylet,  Video-laryngoscopy and Rigid stylet Placement Confirmation: ETT inserted through vocal cords under direct vision,  breath sounds checked- equal and bilateral and CO2 detector Secured at: 23 cm Tube secured with: Tape Dental Injury: Teeth and Oropharynx as per pre-operative assessment  Difficulty Due To: Difficulty was anticipated, Difficult Airway- due to large tongue, Difficult Airway- due to reduced neck mobility, Difficult Airway-  due to edematous airway and Difficult Airway- due to limited oral opening

## 2020-03-29 NOTE — Anesthesia Procedure Notes (Addendum)
Central Venous Catheter Insertion Performed by: Atilano Median, DO, anesthesiologist Start/End11/07/2019 10:30 AM, 2020-04-08 10:35 AM Patient location: OR. Preanesthetic checklist: patient identified, IV checked, site marked, risks and benefits discussed, surgical consent, monitors and equipment checked, pre-op evaluation, timeout performed and anesthesia consent Position: Trendelenburg Lidocaine 1% used for infiltration and patient sedated Hand hygiene performed  and maximum sterile barriers used  Catheter size: 8.5 Fr Central line was placed.Sheath introducer Procedure performed using ultrasound guided technique. Ultrasound Notes:anatomy identified, needle tip was noted to be adjacent to the nerve/plexus identified, no ultrasound evidence of intravascular and/or intraneural injection and image(s) printed for medical record Attempts: 1 Following insertion, line sutured, dressing applied and Biopatch. Post procedure assessment: blood return through all ports, free fluid flow and no air  Patient tolerated the procedure well with no immediate complications.

## 2020-03-29 NOTE — Transfer of Care (Signed)
Immediate Anesthesia Transfer of Care Note  Patient: Tyler Guerrero  Procedure(s) Performed: SACRO-ILIAC SCREW FIXATION (N/A Pelvis) OPEN REDUCTION INTERNAL FIXATION (ORIF) PROXIMAL HUMERUS FRACTURE (Left Arm Upper) OPEN REDUCTION INTERNAL FIXATION (ORIF) PELVIC FRACTURE (N/A Pelvis)  Patient Location: ICU  Anesthesia Type:General  Level of Consciousness: sedated and Patient remains intubated per anesthesia plan  Airway & Oxygen Therapy: Patient remains intubated per anesthesia plan and Patient placed on Ventilator (see vital sign flow sheet for setting)  Post-op Assessment: Report given to RN and Post -op Vital signs reviewed and stable  Post vital signs: Reviewed and stable  Last Vitals:  Vitals Value Taken Time  BP    Temp    Pulse 123 03/17/2020 1843  Resp 25 03/20/2020 1843  SpO2 96 % 03/25/2020 1843  Vitals shown include unvalidated device data.  Last Pain:  Vitals:   03/12/2020 0800  TempSrc: Oral  PainSc: 5          Complications: No complications documented.

## 2020-03-29 NOTE — Anesthesia Procedure Notes (Signed)
Arterial Line Insertion Start/End30-Oct-2021 10:15 AM, 04-05-20 10:18 AM Performed by: Atilano Median, DO, anesthesiologist  Patient location: OR. Preanesthetic checklist: patient identified, IV checked, site marked, risks and benefits discussed, surgical consent, monitors and equipment checked, pre-op evaluation, timeout performed and anesthesia consent Lidocaine 1% used for infiltration Right, radial was placed Catheter size: 20 Fr Hand hygiene performed  and maximum sterile barriers used   Attempts: 1 Procedure performed without using ultrasound guided technique. Following insertion, dressing applied. Post procedure assessment: normal and unchanged  Patient tolerated the procedure well with no immediate complications.

## 2020-03-29 NOTE — Anesthesia Postprocedure Evaluation (Deleted)
Anesthesia Post Note  Patient: Tyler Guerrero  Procedure(s) Performed: SACRO-ILIAC SCREW FIXATION (N/A Pelvis) OPEN REDUCTION INTERNAL FIXATION (ORIF) PROXIMAL HUMERUS FRACTURE (Left Arm Upper) OPEN REDUCTION INTERNAL FIXATION (ORIF) PELVIC FRACTURE (N/A Pelvis)     Anesthesia Post Evaluation No complications documented.  Last Vitals:  Vitals:   04-27-20 0800 2020-04-27 0900  BP: 109/77 105/73  Pulse: (!) 111 (!) 113  Resp: 14 13  Temp: 36.8 C   SpO2: 90% 93%    Last Pain:  Vitals:   27-Apr-2020 0800  TempSrc: Oral  PainSc: 5                  Evamaria Detore
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
## 2020-03-30 ENCOUNTER — Inpatient Hospital Stay (HOSPITAL_COMMUNITY): Payer: Managed Care, Other (non HMO)

## 2020-03-30 DIAGNOSIS — S270XXD Traumatic pneumothorax, subsequent encounter: Secondary | ICD-10-CM | POA: Diagnosis not present

## 2020-03-30 DIAGNOSIS — U071 COVID-19: Secondary | ICD-10-CM

## 2020-03-30 DIAGNOSIS — S27322D Contusion of lung, bilateral, subsequent encounter: Secondary | ICD-10-CM | POA: Diagnosis not present

## 2020-03-30 DIAGNOSIS — J9601 Acute respiratory failure with hypoxia: Secondary | ICD-10-CM

## 2020-03-30 DIAGNOSIS — J9602 Acute respiratory failure with hypercapnia: Secondary | ICD-10-CM

## 2020-03-30 LAB — GLUCOSE, CAPILLARY
Glucose-Capillary: 145 mg/dL — ABNORMAL HIGH (ref 70–99)
Glucose-Capillary: 149 mg/dL — ABNORMAL HIGH (ref 70–99)
Glucose-Capillary: 143 mg/dL — ABNORMAL HIGH (ref 70–99)
Glucose-Capillary: 151 mg/dL — ABNORMAL HIGH (ref 70–99)

## 2020-03-30 LAB — BASIC METABOLIC PANEL
Anion gap: 8 (ref 5–15)
BUN: 19 mg/dL (ref 6–20)
CO2: 24 mmol/L (ref 22–32)
Calcium: 7.4 mg/dL — ABNORMAL LOW (ref 8.9–10.3)
Chloride: 111 mmol/L (ref 98–111)
Creatinine, Ser: 1.05 mg/dL (ref 0.61–1.24)
GFR, Estimated: >60 mL/min (ref >60)
Glucose, Bld: 152 mg/dL — ABNORMAL HIGH (ref 70–99)
Potassium: 4.2 mmol/L (ref 3.5–5.1)
Sodium: 143 mmol/L (ref 135–145)

## 2020-03-30 LAB — POCT I-STAT 7, (LYTES, BLD GAS, ICA,H+H)
Acid-Base Excess: 0 mmol/L (ref 0.0–2.0)
Acid-Base Excess: 1 mmol/L (ref 0.0–2.0)
Acid-Base Excess: 2 mmol/L (ref 0.0–2.0)
Acid-Base Excess: 2 mmol/L (ref 0.0–2.0)
Acid-base deficit: 1 mmol/L (ref 0.0–2.0)
Bicarbonate: 25.4 mmol/L (ref 20.0–28.0)
Bicarbonate: 27.2 mmol/L (ref 20.0–28.0)
Bicarbonate: 28.7 mmol/L — ABNORMAL HIGH (ref 20.0–28.0)
Bicarbonate: 28.9 mmol/L — ABNORMAL HIGH (ref 20.0–28.0)
Bicarbonate: 29.3 mmol/L — ABNORMAL HIGH (ref 20.0–28.0)
Calcium, Ion: 1.05 mmol/L — ABNORMAL LOW (ref 1.15–1.40)
Calcium, Ion: 1.06 mmol/L — ABNORMAL LOW (ref 1.15–1.40)
Calcium, Ion: 1.08 mmol/L — ABNORMAL LOW (ref 1.15–1.40)
Calcium, Ion: 1.08 mmol/L — ABNORMAL LOW (ref 1.15–1.40)
Calcium, Ion: 1.09 mmol/L — ABNORMAL LOW (ref 1.15–1.40)
HCT: 25 % — ABNORMAL LOW (ref 39.0–52.0)
HCT: 25 % — ABNORMAL LOW (ref 39.0–52.0)
HCT: 26 % — ABNORMAL LOW (ref 39.0–52.0)
HCT: 26 % — ABNORMAL LOW (ref 39.0–52.0)
HCT: 27 % — ABNORMAL LOW (ref 39.0–52.0)
Hemoglobin: 8.5 g/dL — ABNORMAL LOW (ref 13.0–17.0)
Hemoglobin: 8.5 g/dL — ABNORMAL LOW (ref 13.0–17.0)
Hemoglobin: 8.8 g/dL — ABNORMAL LOW (ref 13.0–17.0)
Hemoglobin: 8.8 g/dL — ABNORMAL LOW (ref 13.0–17.0)
Hemoglobin: 9.2 g/dL — ABNORMAL LOW (ref 13.0–17.0)
O2 Saturation: 86 %
O2 Saturation: 88 %
O2 Saturation: 97 %
O2 Saturation: 97 %
O2 Saturation: 98 %
Patient temperature: 99.4
Patient temperature: 99.3
Patient temperature: 98.5
Patient temperature: 98.5
Potassium: 4.1 mmol/L (ref 3.5–5.1)
Potassium: 4.1 mmol/L (ref 3.5–5.1)
Potassium: 4.2 mmol/L (ref 3.5–5.1)
Potassium: 4.4 mmol/L (ref 3.5–5.1)
Potassium: 4.4 mmol/L (ref 3.5–5.1)
Sodium: 141 mmol/L (ref 135–145)
Sodium: 143 mmol/L (ref 135–145)
Sodium: 143 mmol/L (ref 135–145)
Sodium: 143 mmol/L (ref 135–145)
Sodium: 145 mmol/L (ref 135–145)
TCO2: 27 mmol/L (ref 22–32)
TCO2: 29 mmol/L (ref 22–32)
TCO2: 30 mmol/L (ref 22–32)
TCO2: 31 mmol/L (ref 22–32)
TCO2: 31 mmol/L (ref 22–32)
pCO2 arterial: 49.4 mmHg — ABNORMAL HIGH (ref 32.0–48.0)
pCO2 arterial: 54.9 mmHg — ABNORMAL HIGH (ref 32.0–48.0)
pCO2 arterial: 56.9 mmHg — ABNORMAL HIGH (ref 32.0–48.0)
pCO2 arterial: 57.4 mmHg — ABNORMAL HIGH (ref 32.0–48.0)
pCO2 arterial: 64.4 mmHg — ABNORMAL HIGH (ref 32.0–48.0)
pH, Arterial: 7.265 — ABNORMAL LOW (ref 7.350–7.450)
pH, Arterial: 7.289 — ABNORMAL LOW (ref 7.350–7.450)
pH, Arterial: 7.307 — ABNORMAL LOW (ref 7.350–7.450)
pH, Arterial: 7.321 — ABNORMAL LOW (ref 7.350–7.450)
pH, Arterial: 7.33 — ABNORMAL LOW (ref 7.350–7.450)
pO2, Arterial: 101 mmHg (ref 83.0–108.0)
pO2, Arterial: 105 mmHg (ref 83.0–108.0)
pO2, Arterial: 119 mmHg — ABNORMAL HIGH (ref 83.0–108.0)
pO2, Arterial: 60 mmHg — ABNORMAL LOW (ref 83.0–108.0)
pO2, Arterial: 60 mmHg — ABNORMAL LOW (ref 83.0–108.0)

## 2020-03-30 LAB — CBC
HCT: 29.4 % — ABNORMAL LOW (ref 39.0–52.0)
Hemoglobin: 9.4 g/dL — ABNORMAL LOW (ref 13.0–17.0)
MCH: 28.7 pg (ref 26.0–34.0)
MCHC: 32 g/dL (ref 30.0–36.0)
MCV: 89.6 fL (ref 80.0–100.0)
Platelets: 192 10*3/uL (ref 150–400)
RBC: 3.28 MIL/uL — ABNORMAL LOW (ref 4.22–5.81)
RDW: 15.4 % (ref 11.5–15.5)
WBC: 12.6 10*3/uL — ABNORMAL HIGH (ref 4.0–10.5)
nRBC: 0 % (ref 0.0–0.2)

## 2020-03-30 LAB — TRIGLYCERIDES: Triglycerides: 576 mg/dL — ABNORMAL HIGH (ref <150)

## 2020-03-30 LAB — BPAM RBC
Blood Product Expiration Date: 202110292359
Blood Product Expiration Date: 202111232359
Blood Product Expiration Date: 202111232359
ISSUE DATE / TIME: 202110221918
ISSUE DATE / TIME: 202110222252
ISSUE DATE / TIME: 202110231647
Unit Type and Rh: 5100
Unit Type and Rh: 5100
Unit Type and Rh: 5100

## 2020-03-30 LAB — TYPE AND SCREEN
ABO/RH(D): O POS
Antibody Screen: NEGATIVE
Unit division: 0
Unit division: 0
Unit division: 0

## 2020-03-30 LAB — VITAMIN D 25 HYDROXY (VIT D DEFICIENCY, FRACTURES): Vit D, 25-Hydroxy: 6.36 ng/mL — ABNORMAL LOW (ref 30–100)

## 2020-03-30 MED ORDER — FAMOTIDINE IN NACL 20-0.9 MG/50ML-% IV SOLN: 20.0000 mg | Freq: Once | INTRAVENOUS | Status: DC | PRN

## 2020-03-30 MED ORDER — EPINEPHRINE 0.3 MG/0.3ML IJ SOAJ
0.3000 mg | Freq: Once | INTRAMUSCULAR | Status: DC | PRN
  Filled 2020-03-30: qty 0.6

## 2020-03-30 MED ORDER — NALOXONE HCL 0.4 MG/ML IJ SOLN
INTRAMUSCULAR | Status: AC
  Filled 2020-03-30: qty 1

## 2020-03-30 MED ORDER — SODIUM CHLORIDE 0.9 % IV SOLN: INTRAVENOUS | Status: DC | PRN

## 2020-03-30 MED ORDER — DIPHENHYDRAMINE HCL 50 MG/ML IJ SOLN: 50.0000 mg | Freq: Once | INTRAMUSCULAR | Status: DC | PRN

## 2020-03-30 MED ORDER — SODIUM CHLORIDE 0.9 % IV SOLN
200.0000 mg | Freq: Once | INTRAVENOUS | Status: DC
  Filled 2020-03-30: qty 40

## 2020-03-30 MED ORDER — ACETAMINOPHEN 325 MG PO TABS: 650.0000 mg | ORAL_TABLET | Freq: Four times a day (QID) | ORAL | Status: DC | PRN

## 2020-03-30 MED ORDER — SODIUM CHLORIDE 0.9 % IV SOLN: 100.0000 mg | Freq: Every day | INTRAVENOUS | Status: DC

## 2020-03-30 MED ORDER — ORAL CARE MOUTH RINSE
15.0000 mL | OROMUCOSAL | Status: DC
  Administered 2020-03-30 – 2020-03-31 (×12): 15 mL via OROMUCOSAL

## 2020-03-30 MED ORDER — METHYLPREDNISOLONE SODIUM SUCC 125 MG IJ SOLR: 125.0000 mg | Freq: Once | INTRAMUSCULAR | Status: DC | PRN

## 2020-03-30 MED ORDER — ALBUTEROL SULFATE HFA 108 (90 BASE) MCG/ACT IN AERS
2.0000 | INHALATION_SPRAY | Freq: Once | RESPIRATORY_TRACT | Status: DC | PRN
  Filled 2020-03-30: qty 6.7

## 2020-03-30 MED ORDER — SODIUM CHLORIDE 0.9 % IV SOLN
Freq: Once | INTRAVENOUS | Status: AC
  Filled 2020-03-30: qty 20

## 2020-03-30 MED ORDER — CHLORHEXIDINE GLUCONATE 0.12% ORAL RINSE (MEDLINE KIT)
15.0000 mL | Freq: Two times a day (BID) | OROMUCOSAL | Status: DC
  Administered 2020-03-30 – 2020-03-31 (×4): 15 mL via OROMUCOSAL

## 2020-03-30 NOTE — Progress Notes (Signed)
Patient self extubated. On NRB SpO2 99 HR 123 RR 20. No noted respiratory issues at this time.

## 2020-03-30 NOTE — Progress Notes (Signed)
Pt self extubated this am and NRB at 15L was placed. Pt was satting in the mid 90s with no respiratory issues noted. Pt began c/o of pain around 1400 and this RN gave 1mg  of dilaudid. He reported still feeling pain approximately an hour and a half after that, so another 1mg  of dilaudid was given. Shortly after, pt began desatting to the mid-80s. RT and trauma paged. 0.4mg  of Narcan was given in attempts to reverse low respiratory drive. Pt continued to have O2 sats in the mid 80's so 15L HFNC was placed in addition to 15L on NRB. Stat CXR was obtained. Pt's O2 sats began coming up and is currently at 92%. Will await further orders.

## 2020-03-30 NOTE — Progress Notes (Signed)
RT obtained ABG on pt with the following results w/no changes needed at this time. RT will continue to monitor.   Results for Tyler Guerrero, Tyler Guerrero (MRN [id number]) as of 03/30/2020 04:00  Ref. Range 03/30/2020 03:52  Sample type Unknown ARTERIAL  pH, Arterial Latest Ref Range: 7.35 - 7.45  7.321 (L)  pCO2 arterial Latest Ref Range: 32 - 48 mmHg 49.4 (H)  pO2, Arterial Latest Ref Range: 83 - 108 mmHg 119 (H)  TCO2 Latest Ref Range: 22 - 32 mmol/L 27  Acid-base deficit Latest Ref Range: 0.0 - 2.0 mmol/L 1.0  Bicarbonate Latest Ref Range: 20.0 - 28.0 mmol/L 25.4  O2 Saturation Latest Units: % 98.0  Patient temperature Unknown 99.4 F  Collection site Unknown art line

## 2020-03-30 NOTE — Progress Notes (Signed)
Assisted tele visit to patient with family member.  Karlissa Aron R, RN  

## 2020-03-30 NOTE — Progress Notes (Signed)
   03/30/20 0800  Airway 8 mm  Placement Date/Time: 03/30/2020 (c) 0956   Difficult airway due to:: Difficulty was anticipated;Difficult airway - due to large tongue;Difficult airway - due to reduced neck mobility;Difficult airway - due to edematous airway;Difficult airway - due to l...  Secured at (cm) 29 cm  Measured From Lips  Secured Location Right  Secured By Actuary Repositioned Yes  Cuff Pressure (cm H2O) 30 cm H2O  Site Condition Dry  Adult Ventilator Settings  Vent Type Servo i  Humidity HME  Vent Mode CPAP;PSV  FiO2 (%) 40 %  Pressure Support 5 cmH20  PEEP 5 cmH20  Adult Ventilator Measurements  Peak Airway Pressure 10 L/min  Mean Airway Pressure 7 cmH20  Resp Rate Spontaneous 14 br/min  Resp Rate Total 14 br/min  Spont TV 465 mL  Measured Ve 7.7 mL  Auto PEEP 0 cmH20  Total PEEP 5 cmH20  Adult Ventilator Alarms  Alarms On Y  Ve High Alarm 22 L/min  Ve Low Alarm 4 L/min  Resp Rate High Alarm 35 br/min  Resp Rate Low Alarm 8  PEEP Low Alarm 3 cmH2O  Press High Alarm 40 cmH2O  T Apnea 20 sec(s)  VAP Prevention  Cuff pressure (initial) 30 cm H2O  Daily Weaning Assessment  Daily Assessment of Readiness to Wean Wean protocol criteria met (SBT performed)  SBT Method CPAP 5 cm H20 and PS 5 cm H20  Weaning Start Time 0800  Breath Sounds  Bilateral Breath Sounds Diminished  Airway Suctioning/Secretions  Suction Type ETT  Suction Device  Catheter  Secretion Amount Small  Secretion Color Tan  Secretion Consistency Thick  Suction Tolerance Tolerated well  Suctioning Adverse Effects None
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
## 2020-03-30 NOTE — Plan of Care (Signed)
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
## 2020-03-31 ENCOUNTER — Encounter (HOSPITAL_COMMUNITY): Payer: Self-pay

## 2020-03-31 ENCOUNTER — Inpatient Hospital Stay (HOSPITAL_COMMUNITY): Payer: Managed Care, Other (non HMO)

## 2020-03-31 DIAGNOSIS — S42352A Displaced comminuted fracture of shaft of humerus, left arm, initial encounter for closed fracture: Secondary | ICD-10-CM

## 2020-03-31 DIAGNOSIS — S32811A Multiple fractures of pelvis with unstable disruption of pelvic ring, initial encounter for closed fracture: Secondary | ICD-10-CM

## 2020-03-31 HISTORY — DX: Multiple fractures of pelvis with unstable disruption of pelvic ring, initial encounter for closed fracture: S32.811A

## 2020-03-31 HISTORY — DX: Displaced comminuted fracture of shaft of humerus, left arm, initial encounter for closed fracture: S42.352A

## 2020-03-31 LAB — BASIC METABOLIC PANEL
Anion gap: 7 (ref 5–15)
BUN: 15 mg/dL (ref 6–20)
CO2: 29 mmol/L (ref 22–32)
Calcium: 7.8 mg/dL — ABNORMAL LOW (ref 8.9–10.3)
Chloride: 108 mmol/L (ref 98–111)
Creatinine, Ser: 0.67 mg/dL (ref 0.61–1.24)
GFR, Estimated: >60 mL/min (ref >60)
Glucose, Bld: 132 mg/dL — ABNORMAL HIGH (ref 70–99)
Potassium: 3.9 mmol/L (ref 3.5–5.1)
Sodium: 144 mmol/L (ref 135–145)

## 2020-03-31 LAB — GLUCOSE, CAPILLARY
Glucose-Capillary: 124 mg/dL — ABNORMAL HIGH (ref 70–99)
Glucose-Capillary: 134 mg/dL — ABNORMAL HIGH (ref 70–99)
Glucose-Capillary: 124 mg/dL — ABNORMAL HIGH (ref 70–99)
Glucose-Capillary: 131 mg/dL — ABNORMAL HIGH (ref 70–99)

## 2020-03-31 LAB — POCT I-STAT 7, (LYTES, BLD GAS, ICA,H+H)
Acid-Base Excess: 5 mmol/L — ABNORMAL HIGH (ref 0.0–2.0)
Bicarbonate: 30.6 mmol/L — ABNORMAL HIGH (ref 20.0–28.0)
Calcium, Ion: 1.12 mmol/L — ABNORMAL LOW (ref 1.15–1.40)
HCT: 24 % — ABNORMAL LOW (ref 39.0–52.0)
Hemoglobin: 8.2 g/dL — ABNORMAL LOW (ref 13.0–17.0)
O2 Saturation: 87 %
Patient temperature: 98.5
Potassium: 3.6 mmol/L (ref 3.5–5.1)
Sodium: 144 mmol/L (ref 135–145)
TCO2: 32 mmol/L (ref 22–32)
pCO2 arterial: 51.7 mmHg — ABNORMAL HIGH (ref 32.0–48.0)
pH, Arterial: 7.38 (ref 7.350–7.450)
pO2, Arterial: 55 mmHg — ABNORMAL LOW (ref 83.0–108.0)

## 2020-03-31 LAB — CBC
HCT: 25.6 % — ABNORMAL LOW (ref 39.0–52.0)
Hemoglobin: 8.1 g/dL — ABNORMAL LOW (ref 13.0–17.0)
MCH: 28.9 pg (ref 26.0–34.0)
MCHC: 31.6 g/dL (ref 30.0–36.0)
MCV: 91.4 fL (ref 80.0–100.0)
Platelets: 176 10*3/uL (ref 150–400)
RBC: 2.8 MIL/uL — ABNORMAL LOW (ref 4.22–5.81)
RDW: 15.4 % (ref 11.5–15.5)
WBC: 11.9 10*3/uL — ABNORMAL HIGH (ref 4.0–10.5)
nRBC: 0.2 % (ref 0.0–0.2)

## 2020-03-31 LAB — MAGNESIUM: Magnesium: 2 mg/dL (ref 1.7–2.4)

## 2020-03-31 MED ORDER — ENOXAPARIN SODIUM 40 MG/0.4ML ~~LOC~~ SOLN
40.0000 mg | Freq: Two times a day (BID) | SUBCUTANEOUS | Status: DC
  Administered 2020-03-31 – 2020-04-02 (×5): 40 mg via SUBCUTANEOUS
  Filled 2020-03-31 (×5): qty 0.4

## 2020-03-31 MED ORDER — VITAMIN D 25 MCG (1000 UNIT) PO TABS
2000.0000 [IU] | ORAL_TABLET | Freq: Two times a day (BID) | ORAL | Status: DC
  Administered 2020-03-31 – 2020-04-02 (×5): 2000 [IU] via ORAL
  Filled 2020-03-31 (×5): qty 2

## 2020-03-31 MED ORDER — ADULT MULTIVITAMIN W/MINERALS CH
1.0000 | ORAL_TABLET | Freq: Every day | ORAL | Status: DC
  Administered 2020-03-31 – 2020-04-02 (×3): 1 via ORAL
  Filled 2020-03-31 (×2): qty 1

## 2020-03-31 MED ORDER — METHOCARBAMOL 500 MG PO TABS
1000.0000 mg | ORAL_TABLET | Freq: Three times a day (TID) | ORAL | Status: DC
  Administered 2020-03-31 – 2020-04-02 (×7): 1000 mg via ORAL
  Filled 2020-03-31 (×7): qty 2

## 2020-03-31 MED ORDER — OXYCODONE HCL 5 MG/5ML PO SOLN: 5.0000 mg | ORAL | Status: DC | PRN

## 2020-03-31 MED ORDER — ORAL CARE MOUTH RINSE
15.0000 mL | Freq: Two times a day (BID) | OROMUCOSAL | Status: DC
  Administered 2020-03-31 – 2020-04-02 (×5): 15 mL via OROMUCOSAL

## 2020-03-31 MED ORDER — ASCORBIC ACID 500 MG PO TABS
1000.0000 mg | ORAL_TABLET | Freq: Every day | ORAL | Status: DC
  Administered 2020-03-31 – 2020-04-02 (×3): 1000 mg via ORAL
  Filled 2020-03-31 (×2): qty 2

## 2020-03-31 MED ORDER — ZINC SULFATE 220 (50 ZN) MG PO CAPS
220.0000 mg | ORAL_CAPSULE | Freq: Every day | ORAL | Status: DC
  Administered 2020-03-31 – 2020-04-02 (×3): 220 mg via ORAL
  Filled 2020-03-31 (×2): qty 1

## 2020-03-31 MED ORDER — VITAMIN D (ERGOCALCIFEROL) 1.25 MG (50000 UNIT) PO CAPS
50000.0000 [IU] | ORAL_CAPSULE | ORAL | Status: DC
  Administered 2020-03-31: 50000 [IU] via ORAL
  Filled 2020-03-31: qty 1

## 2020-03-31 MED ORDER — CHLORHEXIDINE GLUCONATE 0.12 % MT SOLN
15.0000 mL | Freq: Two times a day (BID) | OROMUCOSAL | Status: DC
  Administered 2020-03-31 – 2020-04-02 (×6): 15 mL via OROMUCOSAL
  Filled 2020-03-31 (×5): qty 15

## 2020-03-31 MED ORDER — SODIUM CHLORIDE 0.9% FLUSH: 10.0000 mL | INTRAVENOUS | Status: DC | PRN

## 2020-03-31 MED ORDER — HYDROMORPHONE HCL 1 MG/ML IJ SOLN
0.5000 mg | INTRAMUSCULAR | Status: DC | PRN
  Administered 2020-04-02: 1 mg via INTRAVENOUS
  Administered 2020-04-03: 0.5 mg via INTRAVENOUS
  Filled 2020-03-31 (×2): qty 1

## 2020-03-31 MED ORDER — SODIUM CHLORIDE 0.9% FLUSH
10.0000 mL | Freq: Two times a day (BID) | INTRAVENOUS | Status: DC
  Administered 2020-03-31 – 2020-04-01 (×4): 10 mL
  Administered 2020-04-02: 20 mL
  Administered 2020-04-02: 10 mL

## 2020-03-31 NOTE — Progress Notes (Signed)
ABG results given to Dr. Wynona Neat and Dr. Bedelia Person. No new orders at this time. RT will continue to monitor.
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
## 2020-03-31 NOTE — Care Plan (Signed)
RN notified Dr. Derrell Lolling of asymptomatic 12-beat run of V-tach. RN received orders to include Magnesium to AM labs. Will continue to monitor patient.

## 2020-03-31 NOTE — TOC CAGE-AID Note (Signed)
Transition of Care [facility]) - CAGE-AID Screening   Patient Details  Name: Tyler Guerrero MRN: [id number] Date of Birth: 01/13/1976  Clinical Narrative: Patient denies use of alcohol. No concerns identified.    CAGE-AID Screening:   Have You Ever Felt You Ought to Cut Down on Your Drinking or Drug Use?: No Have People Annoyed You By Critizing Your Drinking Or Drug Use?: No Have You Felt Bad Or Guilty About Your Drinking Or Drug Use?: No Have You Ever Had a Drink or Used Drugs First Thing In The Morning to Steady Your Nerves or to Get Rid of a Hangover?: No CAGE-AID Score: 0

## 2020-03-31 NOTE — Plan of Care (Signed)
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
## 2020-04-01 ENCOUNTER — Encounter (HOSPITAL_COMMUNITY): Payer: Self-pay | Admitting: Orthopedic Surgery

## 2020-04-01 LAB — BASIC METABOLIC PANEL
Anion gap: 11 (ref 5–15)
BUN: 15 mg/dL (ref 6–20)
CO2: 27 mmol/L (ref 22–32)
Calcium: 8.2 mg/dL — ABNORMAL LOW (ref 8.9–10.3)
Chloride: 106 mmol/L (ref 98–111)
Creatinine, Ser: 0.62 mg/dL (ref 0.61–1.24)
GFR, Estimated: >60 mL/min (ref >60)
Glucose, Bld: 116 mg/dL — ABNORMAL HIGH (ref 70–99)
Potassium: 3.9 mmol/L (ref 3.5–5.1)
Sodium: 144 mmol/L (ref 135–145)

## 2020-04-01 LAB — CBC
HCT: 29.6 % — ABNORMAL LOW (ref 39.0–52.0)
Hemoglobin: 9.2 g/dL — ABNORMAL LOW (ref 13.0–17.0)
MCH: 28.7 pg (ref 26.0–34.0)
MCHC: 31.1 g/dL (ref 30.0–36.0)
MCV: 92.2 fL (ref 80.0–100.0)
Platelets: 243 10*3/uL (ref 150–400)
RBC: 3.21 MIL/uL — ABNORMAL LOW (ref 4.22–5.81)
RDW: 15.1 % (ref 11.5–15.5)
WBC: 15.8 10*3/uL — ABNORMAL HIGH (ref 4.0–10.5)
nRBC: 0.4 % — ABNORMAL HIGH (ref 0.0–0.2)

## 2020-04-01 LAB — GLUCOSE, CAPILLARY
Glucose-Capillary: 114 mg/dL — ABNORMAL HIGH (ref 70–99)
Glucose-Capillary: 131 mg/dL — ABNORMAL HIGH (ref 70–99)
Glucose-Capillary: 131 mg/dL — ABNORMAL HIGH (ref 70–99)
Glucose-Capillary: 107 mg/dL — ABNORMAL HIGH (ref 70–99)

## 2020-04-01 LAB — HEMOGLOBIN A1C
Hgb A1c MFr Bld: 5.9 % — ABNORMAL HIGH (ref 4.8–5.6)
Mean Plasma Glucose: 123 mg/dL

## 2020-04-01 MED ORDER — FUROSEMIDE 10 MG/ML IJ SOLN
20.0000 mg | Freq: Once | INTRAMUSCULAR | Status: AC
  Administered 2020-04-01: 20 mg via INTRAVENOUS
  Filled 2020-04-01: qty 2

## 2020-04-01 MED ORDER — OXYCODONE HCL 5 MG PO TABS
5.0000 mg | ORAL_TABLET | ORAL | Status: DC | PRN
  Administered 2020-04-02: 10 mg via ORAL
  Administered 2020-04-03: 5 mg via ORAL
  Filled 2020-04-01 (×2): qty 1
  Filled 2020-04-01: qty 2

## 2020-04-01 MED ORDER — INFLUENZA VAC SPLIT QUAD 0.5 ML IM SUSY
0.5000 mL | PREFILLED_SYRINGE | INTRAMUSCULAR | Status: DC
  Filled 2020-04-01: qty 0.5
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
## 2020-04-01 NOTE — Progress Notes (Addendum)
Rehab Admissions Coordinator Note:  Patient was screened by Clois Dupes for appropriateness for an Inpatient Acute Rehab Consult per therapy recommendation. Noted  Non Weight bearing for 3 extremities and lifitng to chair. We will follow at a distance to assess for tolerance for more intensive therapies.  Also noted COVID + 10/22. Patients are eligible to be considered for admit to the [facility] when cleared from airborne precautions by acute MD or Infectious disease. Otherwise they will need to be >20 days from their positive test with recovery/improvement in symptoms ( 04/17/2020) or 2 negative tests. Please call me with any questions. I will follow.   Ottie Glazier, RN, MSN Rehab Admissions Coordinator [phone] 02/12/2020 12:39 PM

## 2020-04-02 ENCOUNTER — Inpatient Hospital Stay (HOSPITAL_COMMUNITY): Payer: Managed Care, Other (non HMO)

## 2020-04-02 LAB — BLOOD GAS, ARTERIAL
Acid-base deficit: 0.1 mmol/L (ref 0.0–2.0)
Bicarbonate: 25.5 mmol/L (ref 20.0–28.0)
FIO2: 32
O2 Saturation: 86.6 %
Patient temperature: 36.9
pCO2 arterial: 52.5 mmHg — ABNORMAL HIGH (ref 32.0–48.0)
pH, Arterial: 7.306 — ABNORMAL LOW (ref 7.350–7.450)
pO2, Arterial: 58 mmHg — ABNORMAL LOW (ref 83.0–108.0)

## 2020-04-02 LAB — BASIC METABOLIC PANEL
Anion gap: 12 (ref 5–15)
BUN: 46 mg/dL — ABNORMAL HIGH (ref 6–20)
CO2: 23 mmol/L (ref 22–32)
Calcium: 7.2 mg/dL — ABNORMAL LOW (ref 8.9–10.3)
Chloride: 100 mmol/L (ref 98–111)
Creatinine, Ser: 1.37 mg/dL — ABNORMAL HIGH (ref 0.61–1.24)
GFR, Estimated: >60 mL/min (ref >60)
Glucose, Bld: 126 mg/dL — ABNORMAL HIGH (ref 70–99)
Potassium: 3.8 mmol/L (ref 3.5–5.1)
Sodium: 135 mmol/L (ref 135–145)

## 2020-04-02 LAB — GLUCOSE, CAPILLARY
Glucose-Capillary: 162 mg/dL — ABNORMAL HIGH (ref 70–99)
Glucose-Capillary: 123 mg/dL — ABNORMAL HIGH (ref 70–99)
Glucose-Capillary: 136 mg/dL — ABNORMAL HIGH (ref 70–99)
Glucose-Capillary: 125 mg/dL — ABNORMAL HIGH (ref 70–99)
Glucose-Capillary: 133 mg/dL — ABNORMAL HIGH (ref 70–99)
Glucose-Capillary: 138 mg/dL — ABNORMAL HIGH (ref 70–99)

## 2020-04-02 LAB — URINALYSIS, ROUTINE W REFLEX MICROSCOPIC
Bilirubin Urine: NEGATIVE
Glucose, UA: NEGATIVE mg/dL
Hgb urine dipstick: NEGATIVE
Ketones, ur: NEGATIVE mg/dL
Leukocytes,Ua: NEGATIVE
Nitrite: NEGATIVE
Protein, ur: NEGATIVE mg/dL
Specific Gravity, Urine: 1.039 — ABNORMAL HIGH (ref 1.005–1.030)
pH: 5 (ref 5.0–8.0)

## 2020-04-02 LAB — CBC
HCT: 18.8 % — ABNORMAL LOW (ref 39.0–52.0)
HCT: 22.4 % — ABNORMAL LOW (ref 39.0–52.0)
Hemoglobin: 6.1 g/dL — CL (ref 13.0–17.0)
Hemoglobin: 7.1 g/dL — ABNORMAL LOW (ref 13.0–17.0)
MCH: 29.8 pg (ref 26.0–34.0)
MCH: 29.2 pg (ref 26.0–34.0)
MCHC: 32.4 g/dL (ref 30.0–36.0)
MCHC: 31.7 g/dL (ref 30.0–36.0)
MCV: 91.7 fL (ref 80.0–100.0)
MCV: 92.2 fL (ref 80.0–100.0)
Platelets: 272 10*3/uL (ref 150–400)
Platelets: 224 10*3/uL (ref 150–400)
RBC: 2.05 MIL/uL — ABNORMAL LOW (ref 4.22–5.81)
RBC: 2.43 MIL/uL — ABNORMAL LOW (ref 4.22–5.81)
RDW: 15.2 % (ref 11.5–15.5)
RDW: 15 % (ref 11.5–15.5)
WBC: 26.9 10*3/uL — ABNORMAL HIGH (ref 4.0–10.5)
WBC: 27.2 10*3/uL — ABNORMAL HIGH (ref 4.0–10.5)
nRBC: 2.9 % — ABNORMAL HIGH (ref 0.0–0.2)
nRBC: 2.8 % — ABNORMAL HIGH (ref 0.0–0.2)

## 2020-04-02 LAB — TROPONIN I (HIGH SENSITIVITY)
Troponin I (High Sensitivity): 14 ng/L (ref <18)
Troponin I (High Sensitivity): 10 ng/L (ref <18)

## 2020-04-02 LAB — BRAIN NATRIURETIC PEPTIDE: B Natriuretic Peptide: 113.2 pg/mL — ABNORMAL HIGH (ref 0.0–100.0)

## 2020-04-02 LAB — PREPARE RBC (CROSSMATCH)

## 2020-04-02 MED ORDER — IOHEXOL 300 MG/ML  SOLN
75.0000 mL | Freq: Once | INTRAMUSCULAR | Status: AC | PRN
  Administered 2020-04-02: 75 mL via INTRAVENOUS

## 2020-04-02 MED ORDER — IOHEXOL 350 MG/ML SOLN
75.0000 mL | Freq: Once | INTRAVENOUS | Status: AC | PRN
  Administered 2020-04-02: 75 mL via INTRAVENOUS

## 2020-04-02 MED ORDER — SODIUM CHLORIDE 0.9 % IV BOLUS: 250.0000 mL | Freq: Once | INTRAVENOUS | Status: DC | PRN

## 2020-04-02 MED ORDER — FUROSEMIDE 10 MG/ML IJ SOLN
20.0000 mg | Freq: Once | INTRAMUSCULAR | Status: AC
  Administered 2020-04-02: 20 mg via INTRAVENOUS
  Filled 2020-04-02: qty 2

## 2020-04-02 NOTE — Progress Notes (Signed)
Patient returned from CT without complication, transported on 5L [place], patient had no requests or complaints upon return from CT.

## 2020-04-02 NOTE — Progress Notes (Signed)
Notified by RN of hypotension and rapid response called around 1400. On arrival patient BP in the 80s systolic over 40s diastolic. Patient clammy and tachypniec with shallow breathing and accessory muscle usage. O2 sats initially in the high 80s on 5L. Patient was alert and oriented and able to answer questions and follow commands. Does complain of some back pain. 1L bolus given and pressure improved. Oxygen saturations improved with sitting up and encouraging deep breaths. Bilateral breath sounds present. Oxygen able to be weaned down to 3L. STAT CXR done and without pneumothorax s/p chest tube removal earlier today. STAT labs and CTA ordered to rule out PE. Patient taken to CT once stabilized and transferred to [facility] MICU from CT.   Arterial Blood Gas result 1515:  pO2 58; pCO2 52.5; pH 7.3;  HCO3 25, %O2 Sat 86.6.  CBC, BMET, BNP, type and cross all pending  CTA without acute PE on read, attending MD to review as well.  On arrival to ICU patient sats in high 80s-low 90s on 3L and BP improved to 112/60s. Patient to get lasix, BiPAP ordered if needed.   Juliet Rude, [facility] 04/02/2020, 4:11 PM Please see Amion for pager number during day hours 7:00am-4:30pm
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
## 2020-04-02 NOTE — Progress Notes (Signed)
Confirmed with Dr. Freida Busman okay to give Lovenox, and borderline BP is okay as long as patient is mentating well.

## 2020-04-02 NOTE — Progress Notes (Signed)
Spoke to [employer], [place] at [facility] regarding pt low BP. Notified her that I have held his lasix due to his last BP reading 93/32 and that I will reassess later if he can safely receive it. She agreed and there are no new orders at this time. Will continue to monitor.

## 2020-04-02 NOTE — Progress Notes (Signed)
Sent page to Dr. Freida Busman with Trauma team to notify her HGB resulted at 7.1 but patient's blood pressure is still poor with 100/47(63).

## 2020-04-02 NOTE — Progress Notes (Signed)
Notified phlebotomy patient's blood product finished at 2000, they said they will draw labs 2hr from that time.
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
## 2020-04-02 NOTE — Significant Event (Addendum)
Rapid Response Event Note   Reason for Call :  Hypotension, lethargy, increased supplemental oxygen needs  Initial Focused Assessment:  Pt lying in bed, responds to voice. He is oriented and follows commands. Lung sounds are clear, diminished in the bases L>R. Skin is pale, pt is cool & diaphoretic. No adventitious heart sounds. Tachycardic, regular. Increased supplemental oxygen from [facility] to [facility]. Pt remains tachypneic. He denies chest pain, lightheadedness, and dizziness.   VS: T 97.104F, BP 86/52, HR 108, RR 26, SpO2 93% on 2LNC  Interventions:  -CBG -EKG: ST - provided to Adin Hector, PA -IL IVF bolus  Additional orders received:  -CXR -Labs: BMP, CBC, Trop -ABG  7.306 /52.5 /58 /25.5 -CTA chest  Plan of Care:  Transfer to ICU   Event Summary:  MD Notified: Adin Hector, PA Call Time: 1342 Arrival Time: 1350 End Time: 1600  Jennye Moccasin, RN

## 2020-04-03 ENCOUNTER — Other Ambulatory Visit (HOSPITAL_COMMUNITY): Payer: Managed Care, Other (non HMO)

## 2020-04-03 ENCOUNTER — Inpatient Hospital Stay (HOSPITAL_COMMUNITY): Payer: Managed Care, Other (non HMO) | Admitting: Certified Registered"

## 2020-04-03 ENCOUNTER — Encounter (HOSPITAL_BASED_OUTPATIENT_CLINIC_OR_DEPARTMENT_OTHER): Payer: Self-pay | Admitting: Emergency Medicine

## 2020-04-04 LAB — TYPE AND SCREEN
ABO/RH(D): O POS
Antibody Screen: NEGATIVE
Unit division: 0
Unit division: 0
Unit division: 0

## 2020-04-04 LAB — BPAM RBC
Blood Product Expiration Date: 202111202359
Blood Product Expiration Date: 202111262359
Blood Product Expiration Date: 202111262359
ISSUE DATE / TIME: 202110271746
ISSUE DATE / TIME: 202110280444
ISSUE DATE / TIME: 202110280444
Unit Type and Rh: 5100
Unit Type and Rh: 5100
Unit Type and Rh: 5100

## 2020-04-07 LAB — CULTURE, BLOOD (ROUTINE X 2)
Culture: NO GROWTH
Culture: NO GROWTH
Special Requests: ADEQUATE

## 2020-04-07 NOTE — Progress Notes (Signed)
Chaplain responded to code blue for this patient.  Chaplain, at direction of the medical team, called patient's mother and asked her to come.  Patient died.  Chaplain and MD met with mother, Karen, and patient's brother Danny.  Chaplain and family went bedside and offered prayer and emotional/grief support.  Chaplain obtained contact information for Karen and gave patient placement card.  Chaplain offered space as Karen needed to process all the events and feels lost as to what is next.  Chaplain walked family back to the ED to leave.   °Chaplain Newton Cowan, MDiv. ° ° 04/01/2020 0651  °Clinical Encounter Type  °Visited With Patient and family together;Health care provider  °Visit Type Code;Death;Spiritual support  °Referral From Nurse  °Consult/Referral To Chaplain  °Spiritual Encounters  °Spiritual Needs Grief support;Emotional;Prayer  °Stress Factors  °Patient Stress Factors Loss  ° °

## 2020-04-07 NOTE — Progress Notes (Signed)
Patient coughed up 5-68ml blood, notified Dr. Freida Busman.
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
## 2020-04-07 NOTE — Progress Notes (Signed)
Yesterday afternoon patient had an episode of hypotension which was fluid responsive. Labs were significant for a drop in hemoglobin to 6.0 from 9, and he was transfused 1 unit PRBCs in the evening, to which he responded appropriately. Hgb rose to 7.1 after transfusion. CT chest and CT abd/pelvis last night showed no obvious sources of bleeding. Overnight patient's blood pressures remained 90s-100s systolic, and patient was alert and mentating. I saw the patient twice overnight and he was alert and normotensive both times. He reportedly coughed up a small amount of blood earlier this morning, no desaturations at the time and patient continued to maintain his airway.   I was notified via page at about 4:40am this morning that the patient went into cardiac arrest. On my arrival at the bedside, ACLS was in progress. Per RN, patient coughed up a large amount of blood immediately prior to the arrest and a code was called. Bag mask ventilation in progress. Frank bleeding noted from nose and mouth. Anesthesia called due to known difficult airway and patient was intubated. ACLS was continued, 1u PRBCs transfused, and patient remained in PEA arrest. After 20 minutes of ACLS with persistent PEA arrest, time of death was called at 4:59am.  Cause of massive hemoptysis is unclear. Possibly related to pulmonary contusion from initial injury. May also have been upper GI bleeding and not hemoptysis. Will plan for autopsy. I spoke with the patient's mother and brother in person and updated them on the events leading to the patient's death.  Sophronia Simas, MD [facility] General, Hepatobiliary and Pancreatic Surgery 2020/05/03 6:47 AM

## 2020-04-07 DEATH — deceased

## 2020-04-10 MED FILL — Medication: Qty: 1 | Status: AC
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
## 2021-10-14 IMAGING — DX DG CHEST 1V PORT
1 series · 1 of 1 positions shown · non-contrast
Comparison: 03/30/2020

CLINICAL DATA: Chest tube

EXAM:
PORTABLE CHEST 1 VIEW

[chest ap]
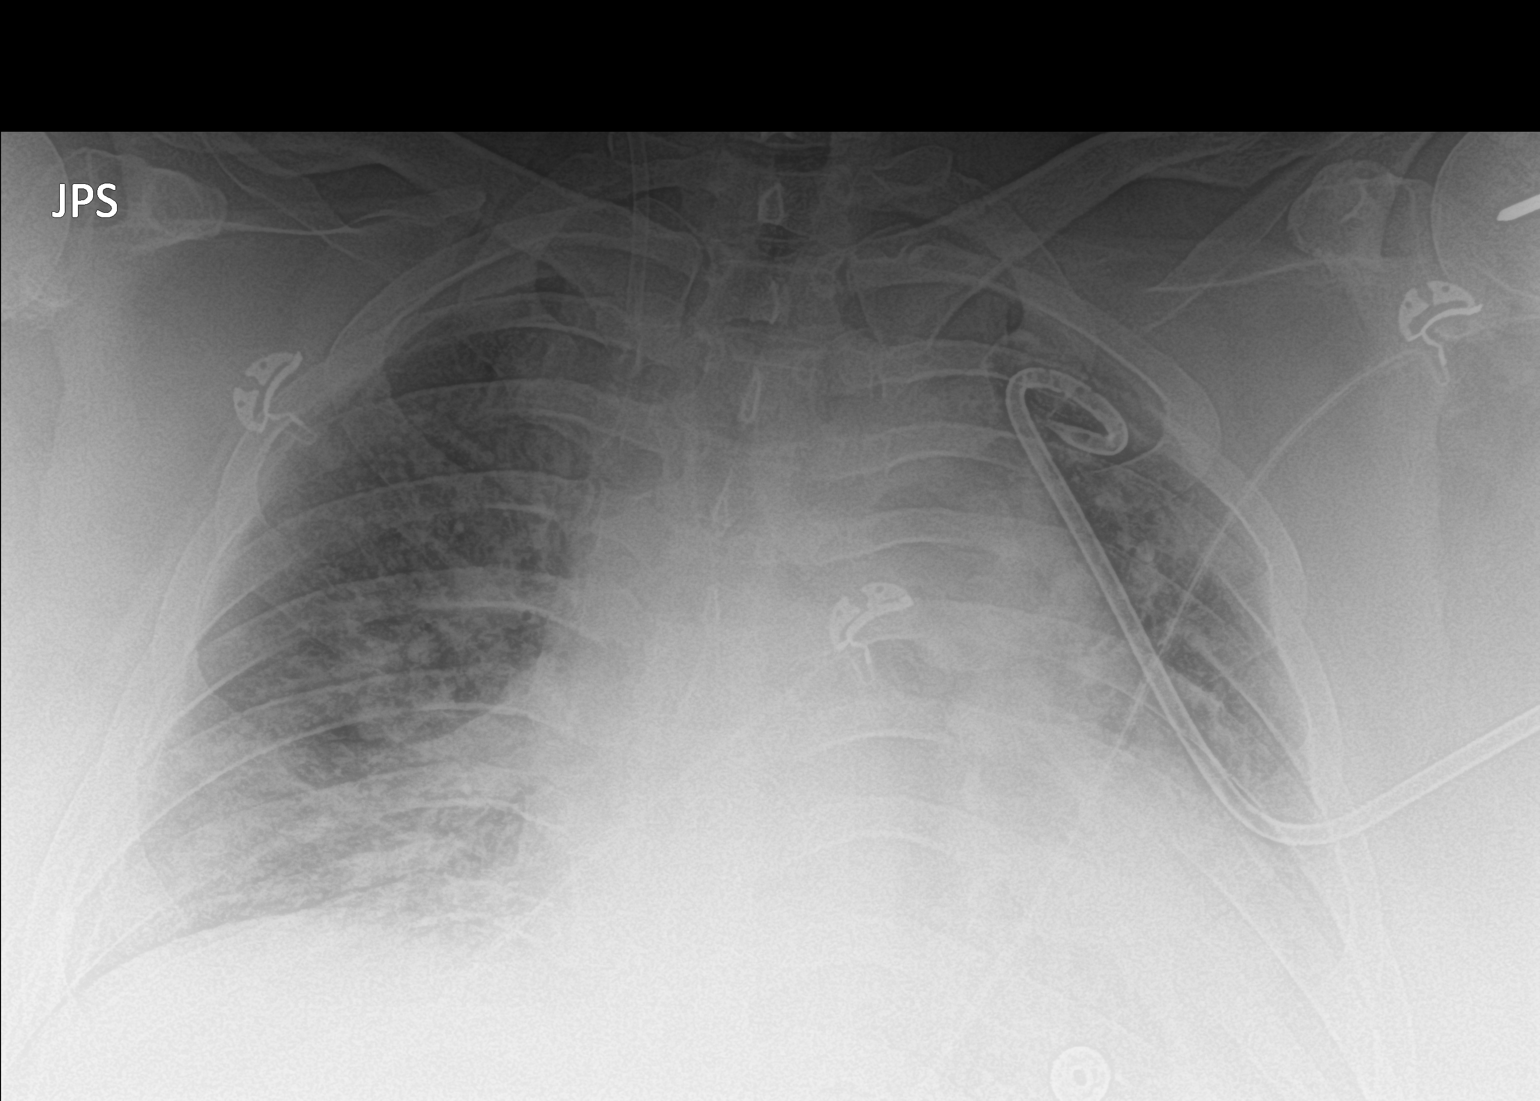

[1 of 1 positions shown; findings below may reference images not displayed]

FINDINGS: 7256 hours. Low lung volumes. The cardio pericardial silhouette is
enlarged. Diffuse bilateral airspace disease again noted. Left chest
tube remains in place without evidence for pneumothorax. Right IJ
sheath is stable. Telemetry leads overlie the chest.
IMPRESSION: Stable exam. Diffuse bilateral airspace disease with left chest tube
in situ. No pneumothorax.

## 2021-10-16 IMAGING — DX DG CHEST 1V PORT
2 series · 2 of 2 positions shown · non-contrast
Comparison: 04/02/2020, 03/31/2020, 03/30/2020

CLINICAL DATA: Chest tube removal

EXAM:
PORTABLE CHEST 1 VIEW

[chest ap (1 of 2)]
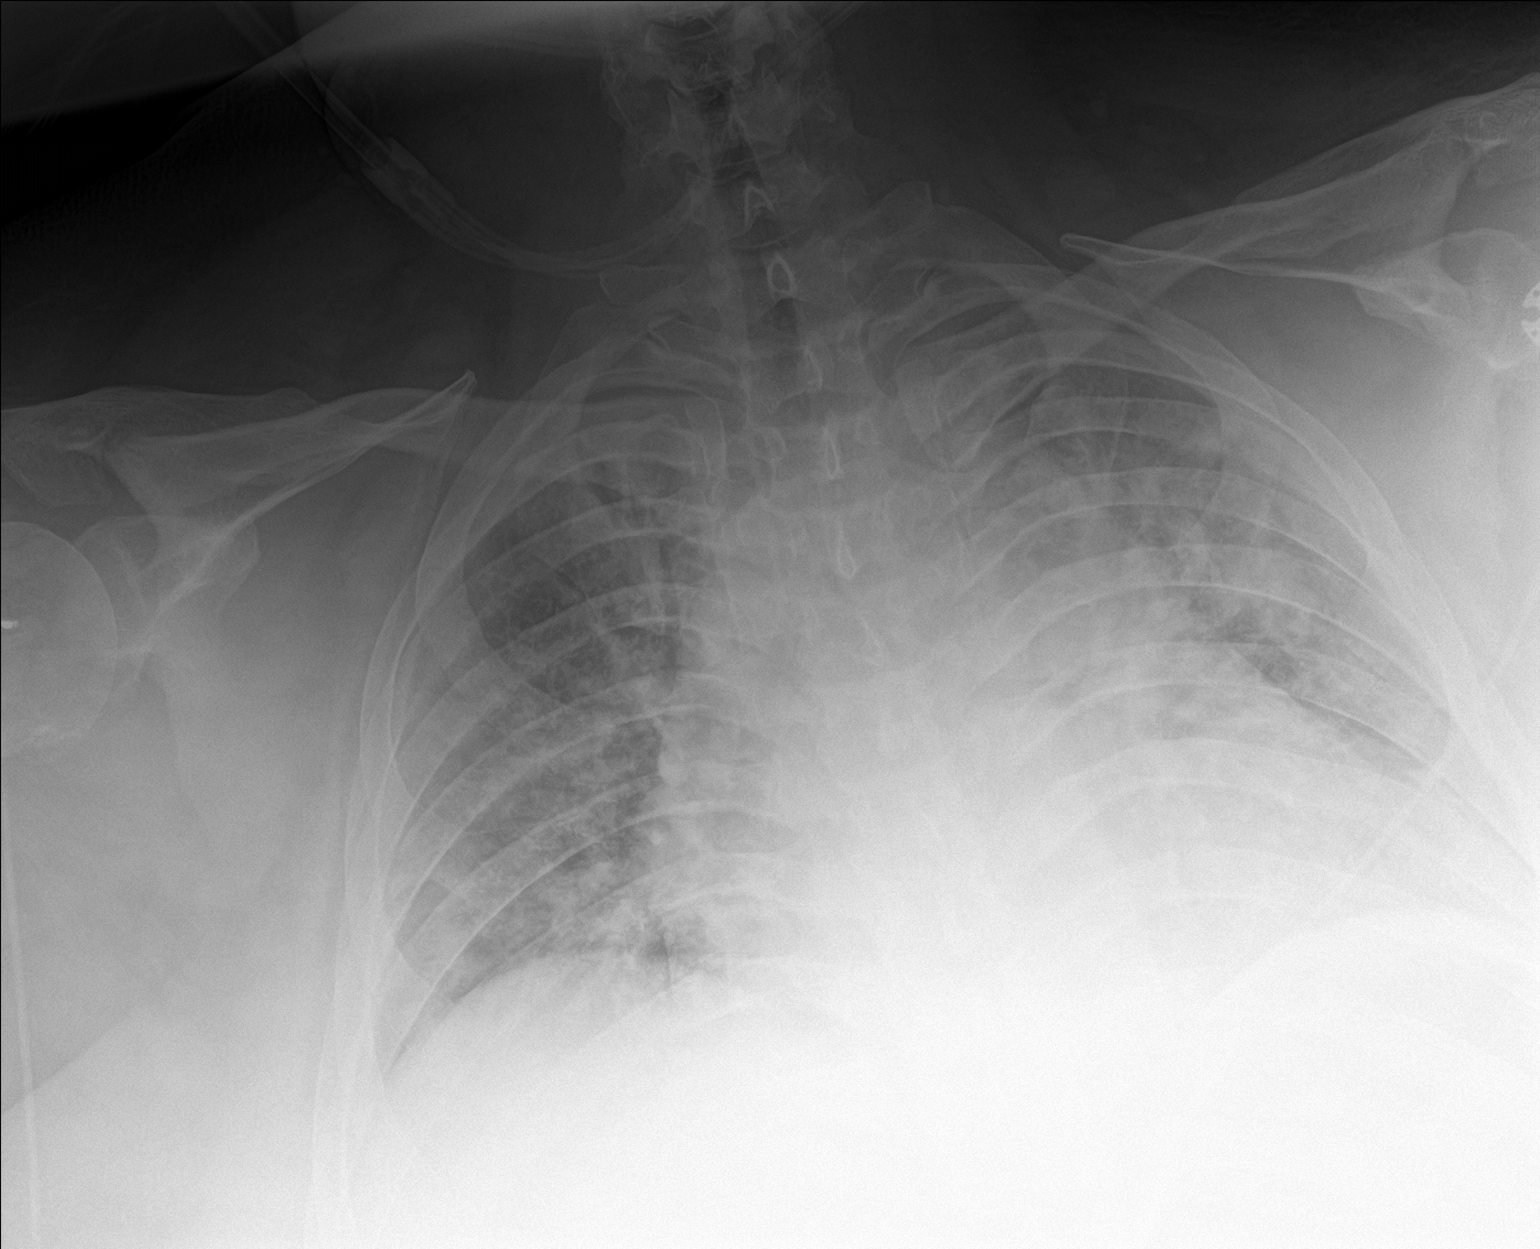

[chest ap (2 of 2)]
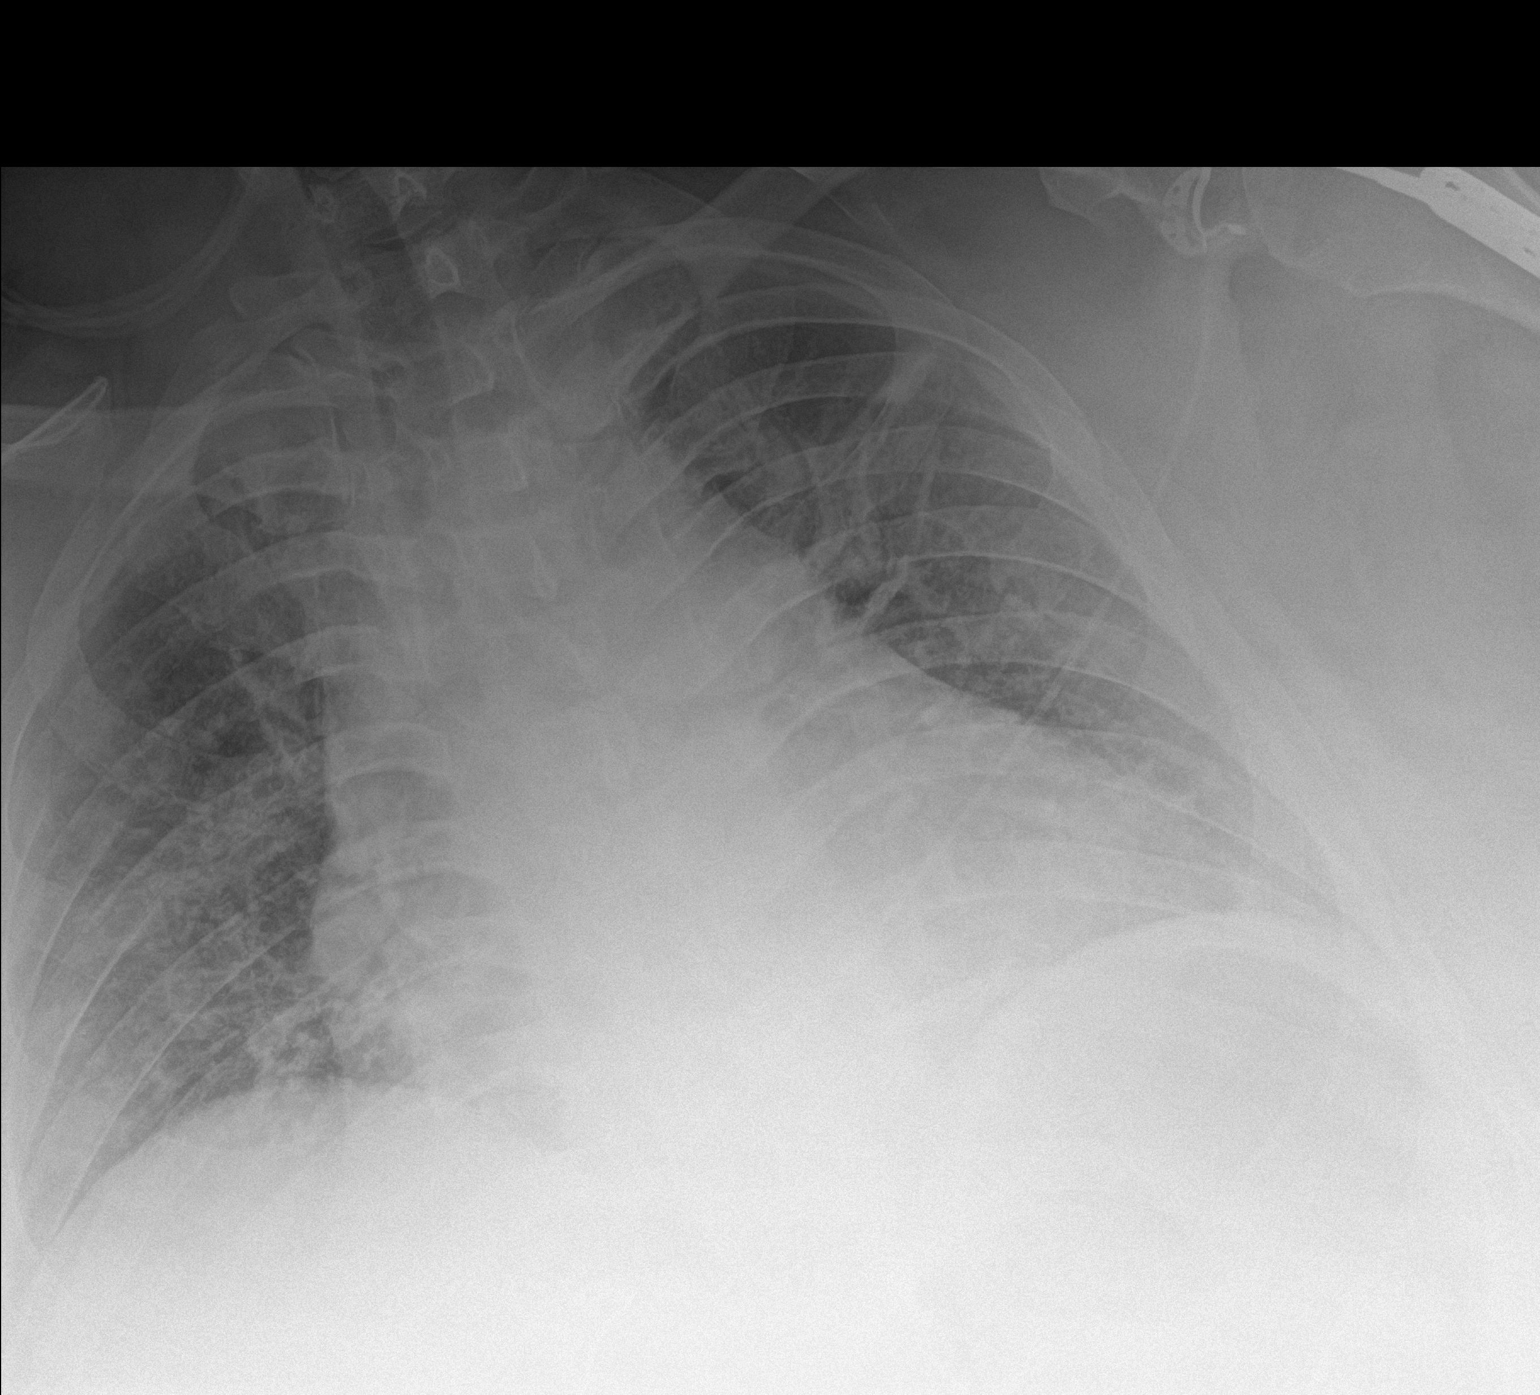

[2 of 2 positions shown; findings below may reference images not displayed]

FINDINGS: Interval removal of left-sided chest tube. No definitive
pneumothorax is seen. Patchy bilateral airspace opacities persist.
Enlarged cardiomediastinal silhouette. No significant pleural
effusion.
IMPRESSION: Interval removal of left-sided chest tube. No pneumothorax. Enlarged
cardiomediastinal silhouette with persistent heterogeneous bilateral
airspace opacities.
